# Patient Record
Sex: Male | Born: 1955 | Race: Black or African American | Marital: Single | Smoking: Never smoker
Health system: Southern US, Community
[De-identification: ages and names within clinical notes are randomized; demographics above are authoritative.]

## PROBLEM LIST (undated history)

## (undated) DIAGNOSIS — B019 Varicella without complication: Secondary | ICD-10-CM

## (undated) DIAGNOSIS — G43909 Migraine, unspecified, not intractable, without status migrainosus: Secondary | ICD-10-CM

## (undated) DIAGNOSIS — J45909 Unspecified asthma, uncomplicated: Secondary | ICD-10-CM

## (undated) DIAGNOSIS — K429 Umbilical hernia without obstruction or gangrene: Secondary | ICD-10-CM

## (undated) DIAGNOSIS — J309 Allergic rhinitis, unspecified: Secondary | ICD-10-CM

## (undated) HISTORY — DX: Unspecified asthma, uncomplicated: J45.909

## (undated) HISTORY — DX: Allergic rhinitis, unspecified: J30.9

## (undated) HISTORY — DX: Migraine, unspecified, not intractable, without status migrainosus: G43.909

## (undated) HISTORY — DX: Varicella without complication: B01.9

---

## 1989-03-18 HISTORY — PX: ARTHROSCOPIC REPAIR ACL: SUR80

## 1997-03-18 HISTORY — PX: NASAL SINUS SURGERY: SHX719

## 1997-06-22 ENCOUNTER — Ambulatory Visit (HOSPITAL_COMMUNITY): Admission: RE | Admit: 1997-06-22 | Discharge: 1997-06-22 | Payer: Self-pay | Admitting: *Deleted

## 2006-05-05 ENCOUNTER — Ambulatory Visit: Payer: Self-pay | Admitting: Internal Medicine

## 2006-06-09 ENCOUNTER — Ambulatory Visit: Payer: Self-pay | Admitting: Internal Medicine

## 2006-06-09 LAB — CONVERTED CEMR LAB
ALT: 59 units/L — ABNORMAL HIGH (ref 0–40)
Alkaline Phosphatase: 73 units/L (ref 39–117)
BUN: 10 mg/dL (ref 6–23)
CO2: 35 meq/L — ABNORMAL HIGH (ref 19–32)
Calcium: 9.2 mg/dL (ref 8.4–10.5)
Creatinine, Ser: 1.2 mg/dL (ref 0.4–1.5)
HCT: 42.9 % (ref 39.0–52.0)
MCHC: 33.7 g/dL (ref 30.0–36.0)
Potassium: 4.9 meq/L (ref 3.5–5.1)
Total Bilirubin: 0.7 mg/dL (ref 0.3–1.2)
Total CHOL/HDL Ratio: 2.2
Total Protein: 6.2 g/dL (ref 6.0–8.3)
WBC: 3.7 10*3/uL — ABNORMAL LOW (ref 4.5–10.5)

## 2006-07-04 ENCOUNTER — Ambulatory Visit: Payer: Self-pay | Admitting: Internal Medicine

## 2006-08-28 ENCOUNTER — Ambulatory Visit: Payer: Self-pay | Admitting: Internal Medicine

## 2006-08-28 LAB — CONVERTED CEMR LAB
ALT: 37 units/L (ref 0–40)
Alkaline Phosphatase: 59 units/L (ref 39–117)
Basophils Absolute: 0 10*3/uL (ref 0.0–0.1)
Eosinophils Absolute: 0.1 10*3/uL (ref 0.0–0.6)
Hep B S Ab: POSITIVE — AB
Hepatitis B Surface Ag: NEGATIVE
MCHC: 34.1 g/dL (ref 30.0–36.0)
MCV: 95 fL (ref 78.0–100.0)
Platelets: 213 10*3/uL (ref 150–400)
RBC: 4.53 M/uL (ref 4.22–5.81)

## 2006-09-10 ENCOUNTER — Ambulatory Visit: Payer: Self-pay | Admitting: Internal Medicine

## 2006-10-28 DIAGNOSIS — J309 Allergic rhinitis, unspecified: Secondary | ICD-10-CM | POA: Insufficient documentation

## 2007-03-09 ENCOUNTER — Ambulatory Visit: Payer: Self-pay | Admitting: Internal Medicine

## 2007-03-15 LAB — CONVERTED CEMR LAB
Basophils Absolute: 0 10*3/uL (ref 0.0–0.1)
Eosinophils Relative: 1.9 % (ref 0.0–5.0)
Glucose, Bld: 92 mg/dL (ref 70–99)
HCT: 47.4 % (ref 39.0–52.0)
MCHC: 34.1 g/dL (ref 30.0–36.0)
Neutrophils Relative %: 38.5 % — ABNORMAL LOW (ref 43.0–77.0)
RBC: 4.98 M/uL (ref 4.22–5.81)
RDW: 12.4 % (ref 11.5–14.6)
WBC: 4 10*3/uL — ABNORMAL LOW (ref 4.5–10.5)

## 2007-03-17 ENCOUNTER — Ambulatory Visit: Payer: Self-pay | Admitting: Internal Medicine

## 2007-03-17 DIAGNOSIS — D709 Neutropenia, unspecified: Secondary | ICD-10-CM | POA: Insufficient documentation

## 2007-03-17 DIAGNOSIS — R7309 Other abnormal glucose: Secondary | ICD-10-CM | POA: Insufficient documentation

## 2007-03-17 DIAGNOSIS — J45909 Unspecified asthma, uncomplicated: Secondary | ICD-10-CM | POA: Insufficient documentation

## 2007-08-31 ENCOUNTER — Ambulatory Visit: Payer: Self-pay | Admitting: Internal Medicine

## 2007-08-31 LAB — CONVERTED CEMR LAB
ALT: 46 units/L (ref 0–53)
AST: 37 units/L (ref 0–37)
Albumin: 3.9 g/dL (ref 3.5–5.2)
BUN: 13 mg/dL (ref 6–23)
CO2: 32 meq/L (ref 19–32)
Chloride: 104 meq/L (ref 96–112)
Cholesterol: 158 mg/dL (ref 0–200)
Creatinine, Ser: 1.1 mg/dL (ref 0.4–1.5)
GFR calc non Af Amer: 75 mL/min
Glucose, Bld: 93 mg/dL (ref 70–99)
Hgb A1c MFr Bld: 5.8 % (ref 4.6–6.0)
LDL Cholesterol: 90 mg/dL (ref 0–99)
Total Protein: 6.5 g/dL (ref 6.0–8.3)
Triglycerides: 33 mg/dL (ref 0–149)

## 2007-09-07 ENCOUNTER — Ambulatory Visit: Payer: Self-pay | Admitting: Internal Medicine

## 2009-07-14 ENCOUNTER — Telehealth: Payer: Self-pay | Admitting: *Deleted

## 2009-07-24 ENCOUNTER — Encounter: Payer: Self-pay | Admitting: Internal Medicine

## 2009-07-25 ENCOUNTER — Telehealth: Payer: Self-pay | Admitting: *Deleted

## 2009-07-27 ENCOUNTER — Telehealth: Payer: Self-pay | Admitting: Internal Medicine

## 2009-08-06 ENCOUNTER — Encounter: Payer: Self-pay | Admitting: Internal Medicine

## 2009-08-30 ENCOUNTER — Ambulatory Visit: Payer: Self-pay | Admitting: Internal Medicine

## 2009-08-30 LAB — CONVERTED CEMR LAB
ALT: 55 units/L — ABNORMAL HIGH (ref 0–53)
Albumin: 4.6 g/dL (ref 3.5–5.2)
Alkaline Phosphatase: 82 units/L (ref 39–117)
Basophils Relative: 0.4 % (ref 0.0–3.0)
Bilirubin, Direct: 0.2 mg/dL (ref 0.0–0.3)
CO2: 34 meq/L — ABNORMAL HIGH (ref 19–32)
Chloride: 104 meq/L (ref 96–112)
Eosinophils Absolute: 0.1 10*3/uL (ref 0.0–0.7)
Eosinophils Relative: 2.3 % (ref 0.0–5.0)
Hemoglobin: 15.7 g/dL (ref 13.0–17.0)
LDL Cholesterol: 94 mg/dL (ref 0–99)
Lymphocytes Relative: 32.2 % (ref 12.0–46.0)
MCHC: 34 g/dL (ref 30.0–36.0)
MCV: 98.9 fL (ref 78.0–100.0)
Monocytes Absolute: 0.5 10*3/uL (ref 0.1–1.0)
Neutro Abs: 3.3 10*3/uL (ref 1.4–7.7)
Neutrophils Relative %: 56.3 % (ref 43.0–77.0)
Nitrite: NEGATIVE
RBC: 4.66 M/uL (ref 4.22–5.81)
Sodium: 145 meq/L (ref 135–145)
Specific Gravity, Urine: 1.015
Total CHOL/HDL Ratio: 2
Total Protein: 6.8 g/dL (ref 6.0–8.3)
Urobilinogen, UA: 0.2
WBC Urine, dipstick: NEGATIVE
WBC: 5.9 10*3/uL (ref 4.5–10.5)

## 2009-09-06 ENCOUNTER — Ambulatory Visit: Payer: Self-pay | Admitting: Internal Medicine

## 2009-09-06 DIAGNOSIS — R635 Abnormal weight gain: Secondary | ICD-10-CM | POA: Insufficient documentation

## 2009-09-06 DIAGNOSIS — E875 Hyperkalemia: Secondary | ICD-10-CM

## 2009-09-06 DIAGNOSIS — R945 Abnormal results of liver function studies: Secondary | ICD-10-CM | POA: Insufficient documentation

## 2009-09-06 DIAGNOSIS — G43909 Migraine, unspecified, not intractable, without status migrainosus: Secondary | ICD-10-CM | POA: Insufficient documentation

## 2009-09-08 ENCOUNTER — Encounter (INDEPENDENT_AMBULATORY_CARE_PROVIDER_SITE_OTHER): Payer: Self-pay | Admitting: *Deleted

## 2009-09-25 ENCOUNTER — Encounter (INDEPENDENT_AMBULATORY_CARE_PROVIDER_SITE_OTHER): Payer: Self-pay | Admitting: *Deleted

## 2009-09-27 ENCOUNTER — Ambulatory Visit: Payer: Self-pay | Admitting: Internal Medicine

## 2009-10-04 ENCOUNTER — Ambulatory Visit: Payer: Self-pay | Admitting: Internal Medicine

## 2009-10-04 LAB — CONVERTED CEMR LAB
Albumin: 4.1 g/dL (ref 3.5–5.2)
BUN: 17 mg/dL (ref 6–23)
CO2: 31 meq/L (ref 19–32)
Calcium: 9.3 mg/dL (ref 8.4–10.5)
Creatinine, Ser: 1 mg/dL (ref 0.4–1.5)
GFR calc non Af Amer: 103.16 mL/min (ref >60)
Glucose, Bld: 93 mg/dL (ref 70–99)
Iron: 128 ug/dL (ref 42–165)
Saturation Ratios: 40.1 % (ref 20.0–50.0)
Sodium: 139 meq/L (ref 135–145)
Total Protein: 6.4 g/dL (ref 6.0–8.3)
Transferrin: 228 mg/dL (ref 212.0–360.0)

## 2009-10-05 ENCOUNTER — Encounter: Payer: Self-pay | Admitting: Internal Medicine

## 2009-10-05 LAB — CONVERTED CEMR LAB
HCV Ab: NEGATIVE
Hepatitis B Surface Ag: NEGATIVE

## 2009-10-09 ENCOUNTER — Ambulatory Visit: Payer: Self-pay | Admitting: Internal Medicine

## 2009-10-09 DIAGNOSIS — N529 Male erectile dysfunction, unspecified: Secondary | ICD-10-CM

## 2009-10-16 ENCOUNTER — Telehealth: Payer: Self-pay | Admitting: *Deleted

## 2010-03-06 ENCOUNTER — Telehealth: Payer: Self-pay | Admitting: *Deleted

## 2010-03-22 ENCOUNTER — Encounter: Payer: Self-pay | Admitting: Internal Medicine

## 2010-04-17 NOTE — Medication Information (Signed)
Summary: Denial of Coverage for Rhinocort  Denial of Coverage for Rhinocort   Imported By: Maryln Gottron 07/31/2009 15:11:58  _____________________________________________________________________  External Attachment:    Type:   Image     Comment:   External Document

## 2010-04-17 NOTE — Progress Notes (Signed)
Summary: Pt req script for Cialis   to [facility]   Phone Note Call from Patient Call back at Home Phone [phone]   Caller: Patient Summary of Call: Pt req to change from Viagra to Cialis everyday.    Please call in script to [facility]  Initial call taken by: Lucy Antigua,  October 16, 2009 11:54 AM  Follow-up for Phone Call        can do this  cilalis 2.5 once daily disp 30  but document reason for change .    Follow-up by: Madelin Headings MD,  October 16, 2009 1:20 PM  Additional Follow-up for Phone Call Additional follow up Details #1::        Pt tried the Viagra and said that it was ok, but pt would like to be able to take a med everyday, that is why he would like to change to Cialis.   Additional Follow-up by: Lucy Antigua,  October 16, 2009 2:42 PM    Additional Follow-up for Phone Call Additional follow up Details #2::    Rx sent to pharmacy. Follow-up by: Romualdo Bolk, CMA (AAMA),  October 16, 2009 3:36 PM  New/Updated Medications: CIALIS 2.5 MG TABS (TADALAFIL) 1 by mouth once daily Prescriptions: CIALIS 2.5 MG TABS (TADALAFIL) 1 by mouth once daily  #30 x 0   Entered by:   Romualdo Bolk, CMA (AAMA)   Authorized by:   Madelin Headings MD   Signed by:   Romualdo Bolk, CMA (AAMA) on 10/16/2009   Method used:   Electronically to        [facility] [address] [phone]* (retail)       [address]       [address]       [place], [place]  [address]       Ph: [phone]       Fax: [phone]   RxID:   [id number]

## 2010-04-17 NOTE — Letter (Signed)
Summary: Previsit letter  [facility]  [address] [place], [place] [address]   Phone: [phone]  Fax: [phone]       09/08/2009 MRN: [id number]  [facility] [address] [place], [place]  [address]  Dear [name],  Welcome to the Gastroenterology Division at [facility].    You are scheduled to see a nurse for your pre-procedure visit on 09/27/2009 at 8:00AM on the 3rd floor at [facility], [address]  We ask that you try to arrive at our office 15 minutes prior to your appointment time to allow for check-in.  Your nurse visit will consist of discussing your medical and surgical history, your immediate family medical history, and your medications.    Please bring a complete list of all your medications or, if you prefer, bring the medication bottles and we will list them.  We will need to be aware of both prescribed and over the counter drugs.  We will need to know exact dosage information as well.  If you are on blood thinners (Coumadin, Plavix, Aggrenox, Ticlid, etc.) please call our office today/prior to your appointment, as we need to consult with your physician about holding your medication.   Please be prepared to read and sign documents such as consent forms, a financial agreement, and acknowledgement forms.  If necessary, and with your consent, a friend or relative is welcome to sit-in on the nurse visit with you.  Please bring your insurance card so that we may make a copy of it.  If your insurance requires a referral to see a specialist, please bring your referral form from your primary care physician.  No co-pay is required for this nurse visit.     If you cannot keep your appointment, please call [phone] to cancel or reschedule prior to your appointment date.  This allows [place] the opportunity to schedule an appointment for another patient in need of care.    Thank you for choosing [facility] for your medical  needs.  We appreciate the opportunity to care for you.  Please visit [place] at our website  to learn more about our practice.                     Sincerely.                                                                                                                   The Gastroenterology Division

## 2010-04-17 NOTE — Medication Information (Signed)
Summary: Prior Authorization Request for Rhinocort AQ  Prior Authorization Request for Rhinocort AQ   Imported By: Maryln Gottron 08/09/2009 14:00:47  _____________________________________________________________________  External Attachment:    Type:   Image     Comment:   External Document

## 2010-04-17 NOTE — Miscellaneous (Signed)
Summary: [facility]  Clinical Lists Changes  Medications: Added new medication of MOVIPREP 100 GM  SOLR (PEG-KCL-NACL-NASULF-NA ASC-C) As per prep instructions. - Signed Rx of MOVIPREP 100 GM  SOLR (PEG-KCL-NACL-NASULF-NA ASC-C) As per prep instructions.;  #1 x 0;  Signed;  Entered by: Ezra Sites RN;  Authorized by: Iva Boop MD, [facility];  Method used: Electronically to [facility] [phone]*, [address], [address], [place], [place]  [address], Ph: [phone], Fax: [phone] Observations: Added new observation of NKA: T (09/27/2009 7:49)    Prescriptions: MOVIPREP 100 GM  SOLR (PEG-KCL-NACL-NASULF-NA ASC-C) As per prep instructions.  #1 x 0   Entered by:   Ezra Sites RN   Authorized by:   Iva Boop MD, [facility]   Signed by:   Ezra Sites RN on 09/27/2009   Method used:   Electronically to        [facility] [address] [phone]* (retail)       [address]       [address]       [place], [place]  [address]       Ph: [phone]       Fax: [phone]   RxID:   [id number]   Appended Document: [facility] PV pt is c/o occasional dysphagia.  Did not want to reschedule colonoscopy and make OV at this time.  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  He will call office and schedule OV when he knows what his work schedule will be.  Appended Document: [facility] PV call patient re: dysphagia problems, please get a hx and we may be able to schedule for EGD also (without OV)  Appended Document: [facility] I spoke with the patient he has solid food dysphagia and he has had to induce vomiting.  He feels the food just stops midway in his chest. This happens about 1 time a week for the last 3-4 months.  He has asked me to cancel his scheduled colon.  He would like Dr Leone Payor to review and we can set up both procedures for Sept.     Appended Document: [facility] PV chart reviewed he can be set up for EGD/dili and colonoscopy direct in [facility]  Appended Document: [facility] PV Patient aware, he wants to  call back to schedule in [facility] Sept or October

## 2010-04-17 NOTE — Progress Notes (Signed)
Summary: Change from nasonex to flonase  Phone Note From Pharmacy   Caller: [facility] Reason for Call: Patient requests substitution Summary of Call: Pt wants rx changed from Nasonex to flonase. Can we do this? Initial call taken by: Romualdo Bolk, CMA Duncan Dull),  Jul 27, 2009 10:41 AM  Follow-up for Phone Call        Per Dr. Fabian Sharp- okay to do. Rx sent to pharmacy. Follow-up by: Romualdo Bolk, CMA (AAMA),  Jul 27, 2009 11:32 AM    New/Updated Medications: FLUTICASONE PROPIONATE 50 MCG/ACT SUSP (FLUTICASONE PROPIONATE) 2 sprays in each nostril daily Prescriptions: FLUTICASONE PROPIONATE 50 MCG/ACT SUSP (FLUTICASONE PROPIONATE) 2 sprays in each nostril daily  #1 x 11   Entered by:   Romualdo Bolk, CMA (AAMA)   Authorized by:   Madelin Headings MD   Signed by:   Romualdo Bolk, CMA (AAMA) on 07/27/2009   Method used:   Electronically to        [facility] [address] [phone]* (retail)       [address]       [address]       [place], [place]  [address]       Ph: [phone]       Fax: [phone]   RxID:   [id number]

## 2010-04-17 NOTE — Progress Notes (Signed)
Summary: Ins denied the rhinocort will cover flonase gen or nasonex  Phone Note Call from Patient   Caller: Patient   [phone] Reason for Call: Talk to Nurse, Talk to Doctor Summary of Call: Pt called to adv that [facility] that they have not received any info concerning a Rx for pt .Marland Kitchen... Pt [facility] [address] [facility] medication:  RHINOCORT AQUA 32 MCG/ACT SUSP.  Pt can be reached at [phone] with any questions or concerns.  Initial call taken by: Debbra Riding,  Jul 25, 2009 2:09 PM  Follow-up for Phone Call        Rx was denied by the pharmacy. Pt's ins will cover generic flonase or Nasonex. Pt is aware of this and would like to try one of these rx's. Can we call in this into his pharmacy? Follow-up by: Romualdo Bolk, CMA Duncan Dull),  Jul 25, 2009 4:13 PM  Additional Follow-up for Phone Call Additional follow up Details #1::        nasonex; 2 sprays each nares q d  disp 1 refill x 12  Additional Follow-up by: Madelin Headings MD,  Jul 26, 2009 8:25 AM    Additional Follow-up for Phone Call Additional follow up Details #2::    Rx sent electronically and pt aware of the change. Follow-up by: Romualdo Bolk, CMA (AAMA),  Jul 26, 2009 8:37 AM  New/Updated Medications: NASONEX 50 MCG/ACT SUSP (MOMETASONE FUROATE) 2 sprays in each nostrill daily Prescriptions: NASONEX 50 MCG/ACT SUSP (MOMETASONE FUROATE) 2 sprays in each nostrill daily  #1 x 12   Entered by:   Romualdo Bolk, CMA (AAMA)   Authorized by:   Madelin Headings MD   Signed by:   Romualdo Bolk, CMA (AAMA) on 07/26/2009   Method used:   [facility]  [address] [phone]* (retail)       [address]       [place], [place]  [id number]       Ph: [phone] or [phone]       Fax: [phone]   RxID:   [id number]

## 2010-04-17 NOTE — Progress Notes (Signed)
Summary: Rx Refill  Phone Note Refill Request Call back at Home Phone [phone] Message from:  Patient on July 14, 2009 3:47 PM  Refills Requested: Medication #1:  RHINOCORT AQUA 32 MCG/ACT SUSP Spray 2 spray into both nostrils once a day   Notes: Generic Pt would like to have generic sent in.   Method Requested: Electronic Initial call taken by: Trixie Dredge,  July 14, 2009 3:48 PM  Follow-up for Phone Call        Rx sent to the pharmacy. Follow-up by: Romualdo Bolk, CMA Duncan Dull),  July 14, 2009 3:51 PM    Prescriptions: RHINOCORT AQUA 32 MCG/ACT SUSP (BUDESONIDE (NASAL)) Spray 2 spray into both nostrils once a day  #1 x 1   Entered by:   Romualdo Bolk, CMA (AAMA)   Authorized by:   Madelin Headings MD   Signed by:   Romualdo Bolk, CMA (AAMA) on 07/14/2009   Method used:   [facility]  [address] [phone]* (retail)       [address]       [place], [place]  [id number]       Ph: [phone] or [phone]       Fax: [phone]   RxID:   [id number]

## 2010-04-17 NOTE — Letter (Signed)
Summary: [facility] Instructions  [facility]  [address] [place], [place] [address]   Phone: [phone]  Fax: [phone]       HILMER ALIBERTI    04-14-1955    MRN: [id number]        Procedure Day Dorna Bloom:  tuesday 10/10/2009     Arrival Time: 12:30 pm      Procedure Time: 1:30 pm     Location of Procedure:                    _x _  [facility] (4th Floor)                        PREPARATION FOR COLONOSCOPY WITH MOVIPREP   Starting 5 days prior to your procedure Thursday 7/21 do not eat nuts, seeds, popcorn, corn, beans, peas,  salads, or any raw vegetables.  Do not take any fiber supplements (e.g. Metamucil, Citrucel, and Benefiber).  THE DAY BEFORE YOUR PROCEDURE         DATE: Monday 7/25 1.  Drink clear liquids the entire day-NO SOLID FOOD  2.  Do not drink anything colored red or purple.  Avoid juices with pulp.  No orange juice.  3.  Drink at least 64 oz. (8 glasses) of fluid/clear liquids during the day to prevent dehydration and help the prep work efficiently.  CLEAR LIQUIDS INCLUDE: Water Jello Ice Popsicles Tea (sugar ok, no milk/cream) Powdered fruit flavored drinks Coffee (sugar ok, no milk/cream) Gatorade Juice: apple, white grape, white cranberry  Lemonade Clear bullion, consomm, broth Carbonated beverages (any kind) Strained chicken noodle soup Hard Candy                             4.  In the morning, mix first dose of MoviPrep solution:    Empty 1 Pouch A and 1 Pouch B into the disposable container    Add lukewarm drinking water to the top line of the container. Mix to dissolve    Refrigerate (mixed solution should be used within 24 hrs)  5.  Begin drinking the prep at 5:00 p.m. The MoviPrep container is divided by 4 marks.   Every 15 minutes drink the solution down to the next mark (approximately 8 oz) until the full liter is complete.   6.  Follow completed prep with 16 oz of clear liquid of your choice (Nothing  red or purple).  Continue to drink clear liquids until bedtime.  7.  Before going to bed, mix second dose of MoviPrep solution:    Empty 1 Pouch A and 1 Pouch B into the disposable container    Add lukewarm drinking water to the top line of the container. Mix to dissolve    Refrigerate  THE DAY OF YOUR PROCEDURE      DATE: Tuesday 7/26  Beginning at 8:30 a.m. (5 hours before procedure):         1. Every 15 minutes, drink the solution down to the next mark (approx 8 oz) until the full liter is complete.  2. Follow completed prep with 16 oz. of clear liquid of your choice.    3. You may drink clear liquids until 11:30 am (2 HOURS BEFORE PROCEDURE).   MEDICATION INSTRUCTIONS  Unless otherwise instructed, you should take regular prescription medications with a small sip of water   as early as possible the morning of your  procedure.          OTHER INSTRUCTIONS  You will need a responsible adult at least 53 years of age to accompany you and drive you home.   This person must remain in the waiting room during your procedure.  Wear loose fitting clothing that is easily removed.  Leave jewelry and other valuables at home.  However, you may wish to bring a book to read or  an iPod/MP3 player to listen to music as you wait for your procedure to start.  Remove all body piercing jewelry and leave at home.  Total time from sign-in until discharge is approximately 2-3 hours.  You should go home directly after your procedure and rest.  You can resume normal activities the  day after your procedure.  The day of your procedure you should not:   Drive   Make legal decisions   Operate machinery   Drink alcohol   Return to work  You will receive specific instructions about eating, activities and medications before you leave.    The above instructions have been reviewed and explained to me by   Ezra Sites RN  September 27, 2009 8:20 AM     I fully understand and can  verbalize these instructions _____________________________ Date _________

## 2010-04-17 NOTE — Assessment & Plan Note (Signed)
Summary: ONE MTH ROV // RS   Vital Signs:  Patient profile:   55 year old male Weight:      178 pounds Pulse rate:   72 / minute BP sitting:   120 / 80  (left arm) Cuff size:   regular  Vitals Entered By: Romualdo Bolk, CMA (AAMA) (October 09, 2009 8:25 AM) CC: Follow-up visit on labs   History of Present Illness: [name] comes in today  for follow up of a number of probelms.  HA   calendaring  . but forgot to bring   it to visit . Since last visit Only 2 days of sig HAs; other wise miild  or not present.      Would try no to take med s and just  rested with help.    Trying  to decrease   and avoiding meds.  took  med 10- 15 doses    excedrin  migraine.    Caffiene   slowed down.   Pulmonary:     Reschedule lung functions.     delaying for  financial reasons   but breathing is ok now   sometimes heat and air  is an issue.  LFTS: potassium  may have had a supplement that could have done this.    with potassium  Gi   to get egd also  because of signs /.  Co some problems iwth erectile dysfunction and usure if psychologic vs phsiologic.   Preventive Screening-Counseling & Management  Alcohol-Tobacco     Alcohol drinks/day: 0     Smoking Status: never  Caffeine-Diet-Exercise     Caffeine use/day: less than 1 a day     Does Patient Exercise: yes  Current Medications (verified): 1)  Fluticasone Propionate 50 Mcg/act Susp (Fluticasone Propionate) .... 2 Sprays in Each Nostril Daily 2)  Qvar 40 Mcg/act  Aers (Beclomethasone Dipropionate) .Marland Kitchen.. 1-2 Puffs Two Times A Day As Directed 3)  Proair Hfa 108 (90 Base) Mcg/act Aers (Albuterol Sulfate) .... 2 Puffs Qid As Needed Wheezing  .  Allergies (verified): No Known Drug Allergies  Past History:  Past medical, surgical, family and social histories (including risk factors) reviewed, and no changes noted (except as noted below).  Past Medical History: Reviewed history from 09/06/2009 and no changes required. Allergic  rhinitis Asthma Chicken pox as a child   Migraines Hx of elevated BP readings ? med in past.  Past Surgical History: Reviewed history from 09/06/2009 and no changes required. Sinus surgery 1999 R acl repair 1991     Past History:  Care Management: [facility]- 06/16/09 and 08/20/09 Gastroenterology: Leone Payor  Family History: Reviewed history from 03/17/2007 and no changes required. Family History of Arthritis Family History Diabetes 1st degree relative MOM Family History Hypertension Family History of Cardiovascular disorder Family History Thyroid disease  Social History: Reviewed history from 09/06/2009 and no changes required. 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    Physical Exam  General:  Well-developed,well-nourished,in no acute distress; alert,appropriate and cooperative throughout examination Head:  normocephalic, atraumatic, and no abnormalities observed.   Eyes:  vision grossly intact, pupils equal, and pupils  round.   Neck:  No deformities, masses, or tenderness noted.no thyromegaly.   Lungs:  Normal respiratory effort, chest expands symmetrically. Lungs are clear to auscultation, no crackles or wheezes.no dullness.   Heart:  Normal rate and regular rhythm. S1 and S2 normal without gallop, murmur, click, rub or other extra sounds. Abdomen:  Bowel sounds positive,abdomen soft and non-tender without masses, organomegaly or   noted. umbinsite good  Msk:  no joint swelling and no joint warmth.   Pulses:  pulses intact without delay   Neurologic:  alert & oriented X3, gait normal, and DTRs symmetrical and normal.   Skin:   turgor normal, color normal, no ecchymoses, and no petechiae.   Cervical Nodes:  No lymphadenopathy noted Psych:  Oriented X3, normally interactive, good eye contact, and not anxious appearing.   reviewed  labs  .  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 1/2 to 1 as needed  Problem # 4:  ASTHMA (ICD-493.90) Assessment: Improved  The following medications were removed from the medication list:    Prednisone 20 Mg Tabs (Prednisone) .Marland Kitchen... Take 3 by mouth once daily for 2 days then 2 by mouth once daily for  3 days or as directed His updated medication list for this problem includes:    Qvar 40 Mcg/act Aers (Beclomethasone dipropionate) .Marland Kitchen... 1-2 puffs two times a day as directed    Proair Hfa 108 (90 Base) Mcg/act Aers (Albuterol sulfate) .Marland Kitchen... 2 puffs qid as needed wheezing  .  Complete Medication List: 1)  Fluticasone Propionate 50 Mcg/act Susp (Fluticasone propionate) .... 2 sprays in each nostril daily 2)  Qvar 40 Mcg/act Aers (Beclomethasone dipropionate) .Marland Kitchen.. 1-2 puffs two times a day as directed 3)  Proair Hfa 108 (90 Base) Mcg/act Aers (Albuterol sulfate) .... 2 puffs qid as needed wheezing  . 4)  Viagra 100 Mg Tabs (Sildenafil citrate) .... 1/2 to 1 as needed  Patient Instructions: 1)  continue weaning the caffiene and excedrin migraine as much as possible. 2)  Your lab test are better  3)  Continue q var    every day   .   can use proair as needed.    4)  Can try viagra   avoid  hormone supplements that could possibly cause a problem.   call for  rx if wish  5)  Get the lung function tests when able.  6)  Continue monitoring your headaches . 7)  return office visit in 3 months    8)  am testosterone level  and LH   before visit .

## 2010-04-17 NOTE — Assessment & Plan Note (Signed)
Summary: cpx/njr   Vital Signs:  Patient profile:   55 year old male Height:      61.75 inches Weight:      178 pounds BMI:     32.94 Pulse rate:   78 / minute BP sitting:   128 / 80  (left arm) Cuff size:   regular  Vitals Entered By: Romualdo Bolk, CMA (AAMA) (September 06, 2009 10:42 AM)  Nutrition Counseling: Patient's BMI is greater than 25 and therefore counseled on weight management options. CC: CPX-Pt was seen at [facility] on 06/16/09 and 08/20/09 for cold symptoms. Coughing, congestion and Asthma has increased. Pt was given augemtin in April and a Z-pac and pred in 08/20/09. Pt is also having miagraines. Pt was given a rx by his old md but does remember the name of it.   History of Present Illness: [name] comesin for preventive visit but has multiple other interim issues going on.  Since last visit with [place]  he has had a resurgence of migraine he had in the past  ? triggered with his asthma allergies.  Often right sided dull to sharp . ocass scotoma/  no other neuro   .sx with this. Apparently on suppressive therapy in the remote past. Asthma / Sinusitis ?  Has been seen urgent care( see above)    had been on q var 1 month but using this prn  and on       no rescue  inhaler.  Had neb at [facility] and this helped .  No record available  for this. but did hanve antibioitc andzpack and then augmentin which helped .  Did have some pred at one time ? effect on abov.   No alcohol  or sig tylenol but using migrain med otc such as advil aleve. No chang ein diet or sleep and no OSA by report.   using protein supplements         Also concern about weith gain ober the years   used to weight 156.   Preventive Care Screening  Prior Values:    PSA:  0.60 (08/30/2009)   Preventive Screening-Counseling & Management  Alcohol-Tobacco     Alcohol drinks/day: 0     Smoking Status: never  Caffeine-Diet-Exercise     Caffeine use/day: less than 1 a day     Does Patient  Exercise: yes  Hep-HIV-STD-Contraception     Dental Visit-last 6 months no  Safety-Violence-Falls     Seat Belt Use: yes     Firearms in the Home: no firearms in the home     Smoke Detectors: yes      Blood Transfusions:  no.    Current Medications (verified): 1)  Fluticasone Propionate 50 Mcg/act Susp (Fluticasone Propionate) .... 2 Sprays in Each Nostril Daily 2)  Qvar 40 Mcg/act  Aers (Beclomethasone Dipropionate) .Marland Kitchen.. 1-2 Puffs Two Times A Day As Directed  Allergies (verified): No Known Drug Allergies  Past History:  Past Medical History: Allergic rhinitis Asthma Chicken pox as a child   Migraines Hx of elevated BP readings ? med in past.  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         weight gain  Physical Exam General Appearance: well developed, well nourished, no acute distress mild congestion Eyes: conjunctiva and lids normal, PERRLA, EOMI, WNL glasses  Ears, Nose, Mouth, Throat: TM clear, nares clear, oral exam WNL Neck: supple, no lymphadenopathy, no thyromegaly, no JVD Respiratory: clear to auscultation and  percussion, respiratory effort normal Cardiovascular: regular rate and rhythm, S1-S2, no murmur, rub or gallop, no bruits, peripheral pulses normal and symmetric, no cyanosis, clubbing, edema or varicosities   decrease hair mid leg down   bilaterally  flat feet  Chest: no scars, masses, tenderness; no asymmetry, skin changes, nipple discharge, no gynecomastia   Gastrointestinal: soft, non-tender; no hepatosplenomegaly, masses; active bowel sounds all quadrants, ; no masses, tenderness, hemorrhoids  Genitourinary: no hernia, testicular mass, penile discharge, priapism or 1+  no nodules  prostate enlargement Lymphatic: no cervical, axillary or inguinal adenopathy Musculoskeletal: gait normal, muscle tone and strength WNL, no joint swelling, effusions, discoloration, crepitus  Skin: clear, good turgor, color WNL, no rashes, lesions, or ulcerations Neurologic: normal mental status, normal reflexes, normal strength, sensation, and motion Psychiatric: alert; oriented to person, place and time Other Exam:  see labs  elevated potass and lfts    rest good  EKG nsr  rate 79     Impression & Recommendations:  Problem # 1:  PREVENTIVE HEALTH CARE (ICD-V70.0)  Discussed nutrition,exercise,diet,healthy weight, vitamin D and calcium.      Orders: EKG w/ Interpretation (93000)  Reviewed preventive care protocols, scheduled due services, and updated immunizations.  Problem # 2:  ASTHMA (ICD-493.90) Assessment: Deteriorated  after rx for sinusitis  elsewhere  .  need more data    rec pfts and follow up  and ifneeded will get pulmonary to see His updated medication list for this problem includes:    Qvar 40 Mcg/act Aers (Beclomethasone dipropionate) .Marland Kitchen... 1-2 puffs two times a day as directed    Prednisone 20 Mg Tabs (Prednisone) .Marland Kitchen... Take 3 by mouth once daily for 2 days then 2 by mouth once daily for  3 days or as directed    Proair Hfa 108 (90 Base) Mcg/act Aers (Albuterol sulfate) .Marland Kitchen... 2 puffs qid  as needed wheezing  .  Orders: Pulmonary Referral (Pulmonary)  Problem # 3:  HYPERKALEMIA (ICD-276.7) Assessment: New  limit potassium supplements  . I think thisis  probably u a lab effect   and not true (  no other reason for this)    and seeing this a trend in the clinic.   also ekg not cw lab.   Orders: EKG w/ Interpretation (93000)  Problem # 4:  MIGRAINE HEADACHE (ICD-346.90) Assessment: Deteriorated hx of same and recent rx with  antibiotcs. used to be on prevention med years ago. recently almost daily and using otc migraine med few times a week    at risk for [facility]  .  ho given and counseled and calendar   Problem # 5:  LIVER FUNCTION TESTS, ABNORMAL (ICD-794.8) Assessment: New poss from  meds for has and illness  . Needs .fu   consider [place] if  needed    reviewed paper chart and had this once   3-4 years ago.  Problem # 6:  WEIGHT GAIN (ICD-783.1) seems stable in the past 3 years   weight was  185 in 2008  Complete Medication List: 1)  Fluticasone Propionate 50 Mcg/act Susp (Fluticasone propionate) .... 2 sprays in each nostril daily 2)  Qvar 40 Mcg/act Aers (Beclomethasone dipropionate) .Marland Kitchen.. 1-2 puffs two times a day as directed 3)  Prednisone 20 Mg Tabs (Prednisone) .... Take 3 by mouth once daily for 2 days then 2 by mouth once daily for  3 days or as directed 4)  Proair Hfa 108 (90 Base) Mcg/act Aers (Albuterol sulfate) .... 2 puffs qid as needed wheezing  .  Other Orders: Gastroenterology Referral (GI) Tdap => 8yrs IM ([address]) Admin 1st Vaccine (66440)  Patient Instructions: 1)  take the prednisone to  help the headache and  asthma signs . 2)  take q var 1-2 puffs two times a day every day. 3)  Can  use rescue inhaler  as needed.    in the meantime  4)  I rec  we get Pulmonary function tests to check out your asthma in the meantime . 5)  Avoid potassium supplements for now 6)  BMP,   and  lfts,  hep b aby, hep b ag ,   hep c  aby , ibc panel  .( dx abnormal lfts  and  elevated Potassium ) in 2-3 weeks  7)  ROV in 3-4 weeks  after  labs and   pfts  results are back.  8)  Calendar   your headache in the meantime. Prescriptions: PROAIR HFA 108 (90 BASE) MCG/ACT AERS (ALBUTEROL SULFATE) 2 puffs qid as needed wheezing  .  #1 x 0   Entered and Authorized by:   Madelin Headings MD   Signed by:   Madelin Headings MD on 09/06/2009   Method used:   Electronically to        [facility] [address] [phone]* (retail)       [address]       [address]       [place], [place]  [address]       Ph: [phone]       Fax: [phone]   RxID:   [id number] PREDNISONE 20 MG TABS (PREDNISONE) take 3 by mouth once daily for 2 days then 2 by mouth once daily for  3 days or as directed  #20 x 0   Entered and Authorized by:   Madelin Headings MD   Signed by:   Madelin Headings MD on 09/06/2009   Method used:   Electronically to        [facility] [address] [phone]* (retail)       [address]       [address]       [place], [place]  [address]       Ph: [phone]       Fax: [phone]   RxID:   [id number]    Immunizations Administered:  Tetanus Vaccine:    Vaccine Type: Tdap    Site: right deltoid    Mfr: GlaxoSmithKline    Dose: 0.5 ml    Route: IM    Given by: Romualdo Bolk, CMA (AAMA)    Exp. Date: 06/10/2011    Lot #: [id number]  prolonged visit in addition to preventive care counseling today

## 2010-04-19 NOTE — Progress Notes (Signed)
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  Medication #2:  PROAIR HFA 108 (90 BASE) MCG/ACT AERS 2 puffs qid as needed wheezing  .   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    Initial call taken by: Debbra Riding,  March 06, 2010 10:03 AM  Follow-up for Phone Call        ok x 2 on the proair and 6 on the q var  Follow-up by: Madelin Headings MD,  March 06, 2010 1:20 PM  Additional Follow-up for Phone Call Additional follow up Details #1::        rx sent to pharmacy Additional Follow-up by: Romualdo Bolk, CMA Duncan Dull),  March 06, 2010 3:17 PM    Prescriptions: PROAIR HFA 108 (90 BASE) MCG/ACT AERS (ALBUTEROL SULFATE) 2 puffs qid as needed wheezing  .  #1 x 1   Entered by:   Romualdo Bolk, CMA (AAMA)   Authorized by:   Madelin Headings MD   Signed by:   Romualdo Bolk, CMA (AAMA) on 03/06/2010   Method used:   Electronically to        [facility] [address] [phone]* (retail)       [address]       [address]       [place], [place]  [address]       Ph: [phone]       Fax: [phone]   RxID:   [id number] QVAR 40 MCG/ACT  AERS (BECLOMETHASONE DIPROPIONATE) 1-2 puffs two times a day as directed  #1 x 5   Entered by:   Romualdo Bolk, CMA (AAMA)   Authorized by:   Madelin Headings MD   Signed by:   Romualdo Bolk, CMA (AAMA) on 03/06/2010   Method used:   Electronically to        [facility] [address] [phone]* (retail)       [address]       [address]       [place], [place]  [address]       Ph: [phone]       Fax: [phone]   RxID:   [id number]

## 2010-04-25 NOTE — Letter (Signed)
[facility]   Imported By: Maryln Gottron 04/16/2010 12:42:48  _____________________________________________________________________  External Attachment:    Type:   Image     Comment:   External Document

## 2010-08-03 NOTE — Assessment & Plan Note (Signed)
[facility]                            [facility] OFFICE NOTE   NAME:NETTLESLelon, [name]                     MRN:          [id number]  DATE:05/05/2006                            DOB:          Dec 15, 1955    CHIEF COMPLAINT:  New patient to establish.  Seen recently at [facility]  for pneumonia followup.   HISTORY OF PRESENT ILLNESS:  [name] is a 55 year old, nonsmoking,  single, African American male who comes in today for a first-time visit.  He is establishing with a primary care physician, as his previous one  was Dr. Mila Palmer.  He is no longer in practice.  In the meantime, he saw  [facility] on April 18, 2006 after having had a recurrent cough with  increasing and decreasing shortness of breath, and thought he had  bronchitis since October of 2007.  He had used over-the-counter  medicines without any help, and had been waxing and waning with  __________, however, where he had increased symptoms after [place], and  went to Urgent Care because he then had developed a fever.  He  apparently had a chest x-ray there, and was told that he had perhaps  early pneumonia, and was treated as such.  Since that time, he has  improved, but still has congestion and cough, but no associated fever.   PAST MEDICAL HISTORY:  See database.  1. Arthritis symptoms.  2. Childhood asthma.  3. No hospitalizations.  4. Chickenpox as a child.  5. Seasonal rhinitis.  6. Allergies worse as a child.  He was tested at that time.  7. History of hypertension, and apparently was put on medication at      some point, and had some side effects from the medication.  Was      recently 128/70 of blood pressure.  8. History of migraine headaches.   SURGERIES:  1. Sinusitis 1999.  2. __________ problems 1991.  3. ACL right knee.   FAMILY HISTORY:  Positive for heart disease, hypertension and type 2  diabetes in mother.  Thyroid disease in his sister.  Otherwise,  noncontributory.   SOCIAL HISTORY:  Has 3 years of college.  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  Negative TAD.  Some caffeine.  Does try to do regular exercise.  No pets.  Household of  2.  Five and a half hours of sleep at night.  He does not know when his  last tetanus shot was.   MEDICATIONS:  None.   DRUG ALLERGIES:  NONE.   OBJECTIVE:  Height was not done.  Weight 185.  Temperature 98.8.  Pulse  72 and regular.  Blood pressure 130/80.  A WDWN, healthy-appearing gentleman, in no acute distress, with some  minimal congestion.  HEENT:  Unremarkable except for some mild nasal congestion.  NECK:  Supple without masses, thyromegaly or bruit.  CHEST:  CTA.  BS equal.  CARDIAC:  S1 and S2 without gallops or murmurs.  Peripheral pulses  present without delay.  EXTREMITIES:  Negative for cyanosis, clubbing or edema.  He is not icteric.  NEUROLOGIC:  Grossly normal.   IMPRESSION:  1. Recent history of prolonged cough with recent diagnosis of      pneumonia.  I suspect some underlying reactive airway disease or      others.  2. History of allergic rhinitis and childhood asthma.  3. History of elevated blood pressure with sided effects of      medication.  4. History of sinus surgery.  5. History of anterior cruciate ligament repair.   With his family history, type 2 diabetes, hypertension, etc., he does  need followup for these as appropriate.  He will get the records, and  follow up as needed.     Neta Mends. Panosh, MD  Electronically Signed    WKP/MedQ  DD: 05/11/2006  DT: 05/11/2006  Job #: [id number]

## 2010-08-16 ENCOUNTER — Other Ambulatory Visit: Payer: Self-pay | Admitting: Internal Medicine

## 2010-08-17 NOTE — Telephone Encounter (Signed)
return office visit in 3 months  8) am testosterone level and LH before visit

## 2011-04-16 ENCOUNTER — Other Ambulatory Visit: Payer: Self-pay | Admitting: Internal Medicine

## 2011-04-16 NOTE — Telephone Encounter (Signed)
Call pt and see if he has change providers and sent request to wrong md.

## 2011-04-18 NOTE — Telephone Encounter (Signed)
Pt last ov was in 2011. Pt unable to make ov now due to work. Pt call be reached at [phone]

## 2011-04-19 NOTE — Telephone Encounter (Signed)
Needs OV . IN the mean time ask if he is having any symptoms .  If he  Is stable then can rx 1 inhaler each q var and  proair  Until her gets in for visit.

## 2012-03-23 ENCOUNTER — Encounter: Payer: Self-pay | Admitting: Internal Medicine

## 2012-03-23 ENCOUNTER — Ambulatory Visit (INDEPENDENT_AMBULATORY_CARE_PROVIDER_SITE_OTHER): Payer: BC Managed Care – PPO | Admitting: Internal Medicine

## 2012-03-23 VITALS — BP 124/84 | HR 75 | Temp 97.5°F | Ht 61.75 in | Wt 179.0 lb

## 2012-03-23 DIAGNOSIS — J309 Allergic rhinitis, unspecified: Secondary | ICD-10-CM

## 2012-03-23 DIAGNOSIS — Z0000 Encounter for general adult medical examination without abnormal findings: Secondary | ICD-10-CM

## 2012-03-23 DIAGNOSIS — R7309 Other abnormal glucose: Secondary | ICD-10-CM

## 2012-03-23 DIAGNOSIS — K429 Umbilical hernia without obstruction or gangrene: Secondary | ICD-10-CM

## 2012-03-23 DIAGNOSIS — G43909 Migraine, unspecified, not intractable, without status migrainosus: Secondary | ICD-10-CM

## 2012-03-23 DIAGNOSIS — J45909 Unspecified asthma, uncomplicated: Secondary | ICD-10-CM

## 2012-03-23 DIAGNOSIS — M25569 Pain in unspecified knee: Secondary | ICD-10-CM

## 2012-03-23 DIAGNOSIS — M25561 Pain in right knee: Secondary | ICD-10-CM

## 2012-03-23 LAB — CBC WITH DIFFERENTIAL/PLATELET
Eosinophils Relative: 1.2 % (ref 0.0–5.0)
HCT: 44.6 % (ref 39.0–52.0)
Hemoglobin: 14.9 g/dL (ref 13.0–17.0)
Lymphs Abs: 2.1 10*3/uL (ref 0.7–4.0)
MCV: 96.9 fl (ref 78.0–100.0)
Monocytes Absolute: 0.4 10*3/uL (ref 0.1–1.0)
Monocytes Relative: 7.2 % (ref 3.0–12.0)
Neutro Abs: 2.8 10*3/uL (ref 1.4–7.7)
Platelets: 194 10*3/uL (ref 150.0–400.0)
WBC: 5.3 10*3/uL (ref 4.5–10.5)

## 2012-03-23 LAB — BASIC METABOLIC PANEL
BUN: 11 mg/dL (ref 6–23)
Chloride: 102 mEq/L (ref 96–112)
Glucose, Bld: 87 mg/dL (ref 70–99)
Potassium: 4.1 mEq/L (ref 3.5–5.1)
Sodium: 140 mEq/L (ref 135–145)

## 2012-03-23 LAB — HEMOGLOBIN A1C: Hgb A1c MFr Bld: 5.9 % (ref 4.6–6.5)

## 2012-03-23 LAB — HEPATIC FUNCTION PANEL
ALT: 41 U/L (ref 0–53)
AST: 31 U/L (ref 0–37)
Albumin: 4 g/dL (ref 3.5–5.2)
Total Bilirubin: 1.1 mg/dL (ref 0.3–1.2)
Total Protein: 7.2 g/dL (ref 6.0–8.3)

## 2012-03-23 LAB — LIPID PANEL
Cholesterol: 169 mg/dL (ref 0–200)
LDL Cholesterol: 96 mg/dL (ref 0–99)
Triglycerides: 48 mg/dL (ref 0.0–149.0)

## 2012-03-23 NOTE — Progress Notes (Signed)
Chief Complaint  Patient presents with  . Annual Exam    Pt is fasting    HPI: Patient comes in today for Preventive Health Care visit  Last visit was 7 2011  Since that time no major changes but has a few issues . Mother got sicjk and had to cancel colonoscopy  .   Past elevated bp ok  asthma  Somewhat  Bothering him recently  Doesn't always take q var daily  Rescue medication works  Right knee:  Hard to run  Can walk   1991  Knee surgery  acl  Has small umbi hernia left over from childhood and recently get sore after certain exercises  HAs still gets migraines  But not seeing specialist now .  Off meds coping.  ROS:  GEN/ HEENT: No fever, significant weight changes sweats headaches vision problems hearing changes, CV/ PULM; No chest pain shortness of breath cough, syncope,edema  change in exercise tolerance. GI /GU: No adominal pain, vomiting, change in bowel habits. No blood in the stool. No significant GU symptoms. SKIN/HEME: ,no acute skin rashes suspicious lesions or bleeding. No lymphadenopathy, nodules, masses.  NEURO/ PSYCH:  No neurologic signs such as weakness numbness. No depression anxiety. IMM/ Allergy: No unusual infections.  Allergy .   REST of 12 system review negative except as per HPI   Past Medical History  Diagnosis Date  . Allergic rhinitis   . Asthma   . Chicken pox     as a child  . Migraines     Family History  Problem Relation Age of Onset  . Diabetes Mellitus II Mother   . Thyroid disease    . Hypertension    . Thyroid disease      History   Social History  . Marital Status: Single    Spouse Name: N/A    Number of Children: N/A  . Years of Education: N/A   Social History Main Topics  . Smoking status: Never Smoker   . Smokeless tobacco: None  . Alcohol Use: No  . Drug Use: None  . Sexually Active: None   Other Topics Concern  . 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 Ascorbic Acid (VITAMIN C) 1000 MG tablet Take 1,000 mg by mouth daily.      . cetirizine (ZYRTEC) 10 MG tablet Take 10 mg by mouth daily.      . Cholecalciferol (VITAMIN D3) 1000 UNITS CAPS Take 1 capsule by mouth daily.      . Flaxseed MISC by Does not apply route. Whole ground      . fluticasone (VERAMYST) 27.5 MCG/SPRAY nasal spray Place 2 sprays into the nose 2 (two) times daily.      [name] Mushroom POWD 1 capsule by Does not apply route daily.      . Multiple Vitamins-Minerals (WHOLE FOOD MULTIVITAMIN) TABS Take 1 tablet by mouth 3 (three) times daily.      . Omega-3 Fatty Acids (OMEGA 3 PO) Take 1 capsule by mouth 2 (two) times daily.      . Probiotic Product (PROBIOTIC PO) Take 1 capsule by mouth daily. Green Vibrance      . vitamin E 400 UNIT capsule Take 400 Units by mouth 3 (three) times daily.      Marland Kitchen  WHEY PROTEIN PO Take 60 g by mouth.      Marland Kitchen PROAIR HFA 108 (90 BASE) MCG/ACT inhaler INHALE 2 PUFFS BY MOUTH FOUR TIMES DAILY AS NEEDED FOR WHEEZING  1 Inhaler  0  . QVAR 40 MCG/ACT inhaler INHALE 1 TO 2 PUFFS BY MOUTH TWICE DAILY AS DIRECTED  1 Inhaler  0    EXAM:  BP 124/84  Pulse 75  Temp 97.5 F (36.4 C) (Oral)  Ht 5' 1.75" (1.568 m)  Wt 179 lb (81.194 kg)  BMI 33.01 kg/m2  SpO2 97%  Body mass index is 33.01 kg/(m^2).  Physical Exam: Vital signs reviewed [id number] is a well-developed well-nourished alert cooperative   male who appears  stated age in no acute distress.  HEENT: normocephalic atraumatic , Eyes: PERRL EOM's full, conjunctiva clear, glasses  Nares: paten,t no deformity discharge or tenderness., Ears: no deformity EAC's clear TMs with normal landmarks. Mouth: clear OP, no lesions, edema.  Moist mucous membranes. Dentition in adequate repair. NECK: supple without masses, thyromegaly or bruits. CHEST/PULM:  Clear to  auscultation and percussion breath sounds equal no wheeze , rales or rhonchi. No chest wall deformities or tenderness. CV: PMI is nondisplaced, S1 S2 no gallops, murmurs, rubs. Peripheral pulses are full without delay.No JVD .  ABDOMEN: Bowel sounds normal nontender  No guard or rebound, no hepato splenomegal no CVA tenderness.   Small 2 fb umbi hernia non tender redudable at this time Extremtities:  No clubbing cyanosis or edema, no acute joint swelling or redness no focal atrophy crepitus right knee no swelling  RECtal: prostate 1 + no nodules or tendernses NEURO:  Oriented x3, cranial nerves 3-12 appear to be intact, no obvious focal weakness,gait within normal limits no abnormal reflexes or asymmetrical SKIN: No acute rashes normal turgor, color, no bruising or petechiae. PSYCH: Oriented, good eye contact, no obvious depression anxiety, cognition and judgment appear normal. LN: no cervical axillary inguinal adenopathy  Lab Results  Component Value Date   WBC 5.9 08/30/2009   HGB 15.7 08/30/2009   HCT 46.1 08/30/2009   PLT 223.0 08/30/2009   GLUCOSE 93 10/04/2009   CHOL 181 08/30/2009   TRIG 44.0 08/30/2009   HDL 78.70 08/30/2009   LDLCALC 94 08/30/2009   ALT 37 10/04/2009   AST 29 10/04/2009   NA 139 10/04/2009   K 4.6 10/04/2009   CL 103 10/04/2009   CREATININE 1.0 10/04/2009   BUN 17 10/04/2009   CO2 31 10/04/2009   TSH 0.93 08/30/2009   PSA 0.60 08/30/2009   HGBA1C 5.8 08/31/2007    ASSESSMENT AND PLAN:  Discussed the following assessment and plan:  1. Visit for preventive health examination  fluticasone (VERAMYST) 27.5 MCG/SPRAY nasal spray, cetirizine (ZYRTEC) 10 MG tablet, WHEY PROTEIN PO, Probiotic Product (PROBIOTIC PO), Flaxseed MISC, Multiple Vitamins-Minerals (WHOLE FOOD MULTIVITAMIN) TABS, Ascorbic Acid (VITAMIN C) 1000 MG tablet, Cholecalciferol (VITAMIN D3) 1000 UNITS CAPS, [place] Mushroom POWD, Omega-3 Fatty Acids (OMEGA 3 PO), vitamin E 400 UNIT capsule, Basic metabolic panel,  CBC with Differential, Hepatic function panel, Lipid panel, TSH, Hemoglobin A1c   declined flu vaccine  call for colonscopy that got delayed  2. ASTHMA  fluticasone (VERAMYST) 27.5 MCG/SPRAY nasal spray, cetirizine (ZYRTEC) 10 MG tablet, WHEY PROTEIN PO, Probiotic Product (PROBIOTIC PO), Flaxseed MISC, Multiple Vitamins-Minerals (WHOLE FOOD MULTIVITAMIN) TABS, Ascorbic Acid (VITAMIN C) 1000 MG tablet, Cholecalciferol (VITAMIN D3) 1000 UNITS CAPS, [place] Mushroom POWD, Omega-3 Fatty Acids (OMEGA 3 PO), vitamin E 400 UNIT capsule, Basic metabolic  panel, CBC with Differential, Hepatic function panel, Lipid panel, TSH, Hemoglobin A1c   advise daily controller med when at risk  3. HYPERGLYCEMIA  fluticasone (VERAMYST) 27.5 MCG/SPRAY nasal spray, cetirizine (ZYRTEC) 10 MG tablet, WHEY PROTEIN PO, Probiotic Product (PROBIOTIC PO), Flaxseed MISC, Multiple Vitamins-Minerals (WHOLE FOOD MULTIVITAMIN) TABS, Ascorbic Acid (VITAMIN C) 1000 MG tablet, Cholecalciferol (VITAMIN D3) 1000 UNITS CAPS, [place] Mushroom POWD, Omega-3 Fatty Acids (OMEGA 3 PO), vitamin E 400 UNIT capsule, Basic metabolic panel, CBC with Differential, Hepatic function panel, Lipid panel, TSH, Hemoglobin A1c   fam hx check a1c today lsi advised  4. ALLERGIC RHINITIS  fluticasone (VERAMYST) 27.5 MCG/SPRAY nasal spray, cetirizine (ZYRTEC) 10 MG tablet, WHEY PROTEIN PO, Probiotic Product (PROBIOTIC PO), Flaxseed MISC, Multiple Vitamins-Minerals (WHOLE FOOD MULTIVITAMIN) TABS, Ascorbic Acid (VITAMIN C) 1000 MG tablet, Cholecalciferol (VITAMIN D3) 1000 UNITS CAPS, [place] Mushroom POWD, Omega-3 Fatty Acids (OMEGA 3 PO), vitamin E 400 UNIT capsule, Basic metabolic panel, CBC with Differential, Hepatic function panel, Lipid panel, TSH, Hemoglobin A1c  5. Umbilical hernia  Ambulatory referral to General Surgery   small since birth recent soreness / sx off and on   6. Knee pain, right     remote acl repair now limiting exercise disc options exercises may  want to see [facility]  7. MIGRAINE HEADACHE     Declined flu vaccine  To set up colonoscopy screen  . Asthma control  Patient Care Team: Madelin Headings, MD as PCP - General Patient Instructions  Contact the [facility] about rescheduling the colonsocopy.  Consider seeing  In sports  medicine [facility] about the [facility] issue. Sometimes exercises can help or change in shoes.  Will contact you about surgery consult about the small umbilical hernia . Will notify you  of labs when available. conctact [place] about medication refills when needed. Advise take controller med every day in risky times or seasons.    Preventive Care for Adults, Male A healthy lifestyle and preventive care can promote health and wellness. Preventive health guidelines for men include the following key practices:  A routine yearly physical is a good way to check with your caregiver about your health and preventative screening. It is a chance to share any concerns and updates on your health, and to receive a thorough exam.  Visit your dentist for a routine exam and preventative care every 6 months. Brush your teeth twice a day and floss once a day. Good oral hygiene prevents tooth decay and gum disease.  The frequency of eye exams is based on your age, health, family medical history, use of contact lenses, and other factors. Follow your caregiver's recommendations for frequency of eye exams.  Eat a healthy diet. Foods like vegetables, fruits, whole grains, low-fat dairy products, and lean protein foods contain the nutrients you need without too many calories. Decrease your intake of foods high in solid fats, added sugars, and salt. Eat the right amount of calories for you.Get information about a proper diet from your caregiver, if necessary.  Regular physical exercise is one of the most important things you can do for your health. Most adults should get at least 150 minutes of  moderate-intensity exercise (any activity that increases your heart rate and causes you to sweat) each week. In addition, most adults need muscle-strengthening exercises on 2 or more days a week.  Maintain a healthy weight. The body mass index (BMI) is a screening tool to identify possible weight problems. It  provides an estimate of body fat based on height and weight. Your caregiver can help determine your BMI, and can help you achieve or maintain a healthy weight.For adults 20 years and older:  A BMI below 18.5 is considered underweight.  A BMI of 18.5 to 24.9 is normal.  A BMI of 25 to 29.9 is considered overweight.  A BMI of 30 and above is considered obese.  Maintain normal blood lipids and cholesterol levels by exercising and minimizing your intake of saturated fat. Eat a balanced diet with plenty of fruit and vegetables. Blood tests for lipids and cholesterol should begin at age 47 and be repeated every 5 years. If your lipid or cholesterol levels are high, you are over 50, or you are a high risk for heart disease, you may need your cholesterol levels checked more frequently.Ongoing high lipid and cholesterol levels should be treated with medicines if diet and exercise are not effective.  If you smoke, find out from your caregiver how to quit. If you do not use tobacco, do not start.  If you choose to drink alcohol, do not exceed 2 drinks per day. One drink is considered to be 12 ounces (355 mL) of beer, 5 ounces (148 mL) of wine, or 1.5 ounces (44 mL) of liquor.  Avoid use of street drugs. Do not share needles with anyone. Ask for help if you need support or instructions about stopping the use of drugs.  High blood pressure causes heart disease and increases the risk of stroke. Your blood pressure should be checked at least every 1 to 2 years. Ongoing high blood pressure should be treated with medicines, if weight loss and exercise are not effective.  If you are 54 to 55 years old,  ask your caregiver if you should take aspirin to prevent heart disease.  Diabetes screening involves taking a blood sample to check your fasting blood sugar level. This should be done once every 3 years, after age 69, if you are within normal weight and without risk factors for diabetes. Testing should be considered at a younger age or be carried out more frequently if you are overweight and have at least 1 risk factor for diabetes.  Colorectal cancer can be detected and often prevented. Most routine colorectal cancer screening begins at the age of 96 and continues through age 80. However, your caregiver may recommend screening at an earlier age if you have risk factors for colon cancer. On a yearly basis, your caregiver may provide home test kits to check for hidden blood in the stool. Use of a small camera at the end of a tube, to directly examine the colon (sigmoidoscopy or colonoscopy), can detect the earliest forms of colorectal cancer. Talk to your caregiver about this at age 42, when routine screening begins. Direct examination of the colon should be repeated every 5 to 10 years through age 52, unless early forms of pre-cancerous polyps or small growths are found.  Hepatitis C blood testing is recommended for all people born from [birth year] through 1965 and any individual with known risks for hepatitis C.  Practice safe sex. Use condoms and avoid high-risk sexual practices to reduce the spread of sexually transmitted infections (STIs). STIs include gonorrhea, chlamydia, syphilis, trichomonas, herpes, HPV, and human immunodeficiency virus (HIV). Herpes, HIV, and HPV are viral illnesses that have no cure. They can result in disability, cancer, and death.  A one-time screening for abdominal aortic aneurysm (AAA) and surgical repair of large AAAs by sound  wave imaging (ultrasonography) is recommended for ages 13 to 67 years who are current or former smokers.  Healthy men should no longer receive  prostate-specific antigen (PSA) blood tests as part of routine cancer screening. Consult with your caregiver about prostate cancer screening.  Testicular cancer screening is not recommended for adult males who have no symptoms. Screening includes self-exam, caregiver exam, and other screening tests. Consult with your caregiver about any symptoms you have or any concerns you have about testicular cancer.  Use sunscreen with skin protection factor (SPF) of 30 or more. Apply sunscreen liberally and repeatedly throughout the day. You should seek shade when your shadow is shorter than you. Protect yourself by wearing long sleeves, pants, a wide-brimmed hat, and sunglasses year round, whenever you are outdoors.  Once a month, do a whole body skin exam, using a mirror to look at the skin on your back. Notify your caregiver of new moles, moles that have irregular borders, moles that are larger than a pencil eraser, or moles that have changed in shape or color.  Stay current with required immunizations.  Influenza. You need a dose every fall (or winter). The composition of the flu vaccine changes each year, so being vaccinated once is not enough.  Pneumococcal polysaccharide. You need 1 to 2 doses if you smoke cigarettes or if you have certain chronic medical conditions. You need 1 dose at age 97 (or older) if you have never been vaccinated.  Tetanus, diphtheria, pertussis (Tdap, Td). Get 1 dose of Tdap vaccine if you are younger than age 28 years, are over 40 and have contact with an infant, are a Research scientist (physical sciences), or simply want to be protected from whooping cough. After that, you need a Td booster dose every 10 years. Consult your caregiver if you have not had at least 3 tetanus and diphtheria-containing shots sometime in your life or have a deep or dirty wound.  HPV. This vaccine is recommended for males 13 through 55 years of age. This vaccine may be given to men 22 through 55 years of age who have not  completed the 3 dose series. It is recommended for men through age 24 who have sex with men or whose immune system is weakened because of HIV infection, other illness, or medications. The vaccine is given in 3 doses over 6 months.  Measles, mumps, rubella (MMR). You need at least 1 dose of MMR if you were born in 1957 or later. You may also need a 2nd dose.  Meningococcal. If you are age 31 to 109 years and a Orthoptist living in a residence hall, or have one of several medical conditions, you need to get vaccinated against meningococcal disease. You may also need additional booster doses.  Zoster (shingles). If you are age 31 years or older, you should get this vaccine.  Varicella (chickenpox). If you have never had chickenpox or you were vaccinated but received only 1 dose, talk to your caregiver to find out if you need this vaccine.  Hepatitis A. You need this vaccine if you have a specific risk factor for hepatitis A virus infection, or you simply wish to be protected from this disease. The vaccine is usually given as 2 doses, 6 to 18 months apart.  Hepatitis B. You need this vaccine if you have a specific risk factor for hepatitis B virus infection or you simply wish to be protected from this disease. The vaccine is given in 3 doses, usually over 6 months.  Preventative Service / Frequency Ages 29 to 71  Blood pressure check.** / Every 1 to 2 years.  Lipid and cholesterol check.** / Every 5 years beginning at age 20.  Hepatitis C blood test.** / For any individual with known risks for hepatitis C.  Skin self-exam. / Monthly.  Influenza immunization.** / Every year.  Pneumococcal polysaccharide immunization.** / 1 to 2 doses if you smoke cigarettes or if you have certain chronic medical conditions.  Tetanus, diphtheria, pertussis (Tdap,Td) immunization. / A one-time dose of Tdap vaccine. After that, you need a Td booster dose every 10 years.  HPV immunization. / 3  doses over 6 months, if 26 and younger.  Measles, mumps, rubella (MMR) immunization. / You need at least 1 dose of MMR if you were born in 1957 or later. You may also need a 2nd dose.  Meningococcal immunization. / 1 dose if you are age 68 to 58 years and a Orthoptist living in a residence hall, or have one of several medical conditions, you need to get vaccinated against meningococcal disease. You may also need additional booster doses.  Varicella immunization.** / Consult your caregiver.  Hepatitis A immunization.** / Consult your caregiver. 2 doses, 6 to 18 months apart.  Hepatitis B immunization.** / Consult your caregiver. 3 doses usually over 6 months. Ages 79 to 82  Blood pressure check.** / Every 1 to 2 years.  Lipid and cholesterol check.** / Every 5 years beginning at age 53.  Fecal occult blood test (FOBT) of stool. / Every year beginning at age 40 and continuing until age 16. You may not have to do this test if you get colonoscopy every 10 years.  Flexible sigmoidoscopy** or colonoscopy.** / Every 5 years for a flexible sigmoidoscopy or every 10 years for a colonoscopy beginning at age 17 and continuing until age 42.  Hepatitis C blood test.** / For all people born from [birth year] through 1965 and any individual with known risks for hepatitis C.  Skin self-exam. / Monthly.  Influenza immunization.** / Every year.  Pneumococcal polysaccharide immunization.** / 1 to 2 doses if you smoke cigarettes or if you have certain chronic medical conditions.  Tetanus, diphtheria, pertussis (Tdap/Td) immunization.** / A one-time dose of Tdap vaccine. After that, you need a Td booster dose every 10 years.  Measles, mumps, rubella (MMR) immunization. / You need at least 1 dose of MMR if you were born in 1957 or later. You may also need a 2nd dose.  Varicella immunization.**/ Consult your caregiver.  Meningococcal immunization.** / Consult your caregiver.  Hepatitis A  immunization.** / Consult your caregiver. 2 doses, 6 to 18 months apart.  Hepatitis B immunization.** / Consult your caregiver. 3 doses, usually over 6 months. Ages 2 and over  Blood pressure check.** / Every 1 to 2 years.  Lipid and cholesterol check.**/ Every 5 years beginning at age 109.  Fecal occult blood test (FOBT) of stool. / Every year beginning at age 41 and continuing until age 27. You may not have to do this test if you get colonoscopy every 10 years.  Flexible sigmoidoscopy** or colonoscopy.** / Every 5 years for a flexible sigmoidoscopy or every 10 years for a colonoscopy beginning at age 74 and continuing until age 48.  Hepatitis C blood test.** / For all people born from [birth year] through 1963 and any individual with known risks for hepatitis C.  Abdominal aortic aneurysm (AAA) screening.** / A one-time screening for ages 11 to 29 years  who are current or former smokers.  Skin self-exam. / Monthly.  Influenza immunization.** / Every year.  Pneumococcal polysaccharide immunization.** / 1 dose at age 29 (or older) if you have never been vaccinated.  Tetanus, diphtheria, pertussis (Tdap, Td) immunization. / A one-time dose of Tdap vaccine if you are over 65 and have contact with an infant, are a Research scientist (physical sciences), or simply want to be protected from whooping cough. After that, you need a Td booster dose every 10 years.  Varicella immunization. ** / Consult your caregiver.  Meningococcal immunization.** / Consult your caregiver.  Hepatitis A immunization. ** / Consult your caregiver. 2 doses, 6 to 18 months apart.  Hepatitis B immunization.** / Check with your caregiver. 3 doses, usually over 6 months. **Family history and personal history of risk and conditions may change your caregiver's recommendations. Document Released: 04/30/2001 Document Revised: 05/27/2011 Document Reviewed: 07/30/2010 [facility] Patient Information 2013 [place], [place].    Neta Mends. Sirenia Whitis  M.D.

## 2012-03-23 NOTE — Patient Instructions (Addendum)
Contact the [facility] department about rescheduling the colonsocopy.  Consider seeing  In sports  medicine Dr [facility] about the [facility] issue. Sometimes exercises can help or change in shoes.  Will contact you about surgery consult about the small umbilical hernia . Will notify you  of labs when available. conctact [place] about medication refills when needed. Advise take controller med every day in risky times or seasons.    Preventive Care for Adults, Male A healthy lifestyle and preventive care can promote health and wellness. Preventive health guidelines for men include the following key practices:  A routine yearly physical is a good way to check with your caregiver about your health and preventative screening. It is a chance to share any concerns and updates on your health, and to receive a thorough exam.  Visit your dentist for a routine exam and preventative care every 6 months. Brush your teeth twice a day and floss once a day. Good oral hygiene prevents tooth decay and gum disease.  The frequency of eye exams is based on your age, health, family medical history, use of contact lenses, and other factors. Follow your caregiver's recommendations for frequency of eye exams.  Eat a healthy diet. Foods like vegetables, fruits, whole grains, low-fat dairy products, and lean protein foods contain the nutrients you need without too many calories. Decrease your intake of foods high in solid fats, added sugars, and salt. Eat the right amount of calories for you.Get information about a proper diet from your caregiver, if necessary.  Regular physical exercise is one of the most important things you can do for your health. Most adults should get at least 150 minutes of moderate-intensity exercise (any activity that increases your heart rate and causes you to sweat) each week. In addition, most adults need muscle-strengthening exercises on 2 or more days a week.  Maintain a healthy  weight. The body mass index (BMI) is a screening tool to identify possible weight problems. It provides an estimate of body fat based on height and weight. Your caregiver can help determine your BMI, and can help you achieve or maintain a healthy weight.For adults 20 years and older:  A BMI below 18.5 is considered underweight.  A BMI of 18.5 to 24.9 is normal.  A BMI of 25 to 29.9 is considered overweight.  A BMI of 30 and above is considered obese.  Maintain normal blood lipids and cholesterol levels by exercising and minimizing your intake of saturated fat. Eat a balanced diet with plenty of fruit and vegetables. Blood tests for lipids and cholesterol should begin at age 11 and be repeated every 5 years. If your lipid or cholesterol levels are high, you are over 50, or you are a high risk for heart disease, you may need your cholesterol levels checked more frequently.Ongoing high lipid and cholesterol levels should be treated with medicines if diet and exercise are not effective.  If you smoke, find out from your caregiver how to quit. If you do not use tobacco, do not start.  If you choose to drink alcohol, do not exceed 2 drinks per day. One drink is considered to be 12 ounces (355 mL) of beer, 5 ounces (148 mL) of wine, or 1.5 ounces (44 mL) of liquor.  Avoid use of street drugs. Do not share needles with anyone. Ask for help if you need support or instructions about stopping the use of drugs.  High blood pressure causes heart disease and increases  the risk of stroke. Your blood pressure should be checked at least every 1 to 2 years. Ongoing high blood pressure should be treated with medicines, if weight loss and exercise are not effective.  If you are 72 to 55 years old, ask your caregiver if you should take aspirin to prevent heart disease.  Diabetes screening involves taking a blood sample to check your fasting blood sugar level. This should be done once every 3 years, after age 24,  if you are within normal weight and without risk factors for diabetes. Testing should be considered at a younger age or be carried out more frequently if you are overweight and have at least 1 risk factor for diabetes.  Colorectal cancer can be detected and often prevented. Most routine colorectal cancer screening begins at the age of 50 and continues through age 84. However, your caregiver may recommend screening at an earlier age if you have risk factors for colon cancer. On a yearly basis, your caregiver may provide home test kits to check for hidden blood in the stool. Use of a small camera at the end of a tube, to directly examine the colon (sigmoidoscopy or colonoscopy), can detect the earliest forms of colorectal cancer. Talk to your caregiver about this at age 28, when routine screening begins. Direct examination of the colon should be repeated every 5 to 10 years through age 87, unless early forms of pre-cancerous polyps or small growths are found.  Hepatitis C blood testing is recommended for all people born from [birth year] through 1963 and any individual with known risks for hepatitis C.  Practice safe sex. Use condoms and avoid high-risk sexual practices to reduce the spread of sexually transmitted infections (STIs). STIs include gonorrhea, chlamydia, syphilis, trichomonas, herpes, HPV, and human immunodeficiency virus (HIV). Herpes, HIV, and HPV are viral illnesses that have no cure. They can result in disability, cancer, and death.  A one-time screening for abdominal aortic aneurysm (AAA) and surgical repair of large AAAs by sound wave imaging (ultrasonography) is recommended for ages 50 to 7 years who are current or former smokers.  Healthy men should no longer receive prostate-specific antigen (PSA) blood tests as part of routine cancer screening. Consult with your caregiver about prostate cancer screening.  Testicular cancer screening is not recommended for adult males who have no symptoms.  Screening includes self-exam, caregiver exam, and other screening tests. Consult with your caregiver about any symptoms you have or any concerns you have about testicular cancer.  Use sunscreen with skin protection factor (SPF) of 30 or more. Apply sunscreen liberally and repeatedly throughout the day. You should seek shade when your shadow is shorter than you. Protect yourself by wearing long sleeves, pants, a wide-brimmed hat, and sunglasses year round, whenever you are outdoors.  Once a month, do a whole body skin exam, using a mirror to look at the skin on your back. Notify your caregiver of new moles, moles that have irregular borders, moles that are larger than a pencil eraser, or moles that have changed in shape or color.  Stay current with required immunizations.  Influenza. You need a dose every fall (or winter). The composition of the flu vaccine changes each year, so being vaccinated once is not enough.  Pneumococcal polysaccharide. You need 1 to 2 doses if you smoke cigarettes or if you have certain chronic medical conditions. You need 1 dose at age 47 (or older) if you have never been vaccinated.  Tetanus, diphtheria, pertussis (Tdap, Td). Get  1 dose of Tdap vaccine if you are younger than age 79 years, are over 76 and have contact with an infant, are a Research scientist (physical sciences), or simply want to be protected from whooping cough. After that, you need a Td booster dose every 10 years. Consult your caregiver if you have not had at least 3 tetanus and diphtheria-containing shots sometime in your life or have a deep or dirty wound.  HPV. This vaccine is recommended for males 13 through 55 years of age. This vaccine may be given to men 22 through 55 years of age who have not completed the 3 dose series. It is recommended for men through age 83 who have sex with men or whose immune system is weakened because of HIV infection, other illness, or medications. The vaccine is given in 3 doses over 6  months.  Measles, mumps, rubella (MMR). You need at least 1 dose of MMR if you were born in 1957 or later. You may also need a 2nd dose.  Meningococcal. If you are age 64 to 64 years and a Orthoptist living in a residence hall, or have one of several medical conditions, you need to get vaccinated against meningococcal disease. You may also need additional booster doses.  Zoster (shingles). If you are age 55 years or older, you should get this vaccine.  Varicella (chickenpox). If you have never had chickenpox or you were vaccinated but received only 1 dose, talk to your caregiver to find out if you need this vaccine.  Hepatitis A. You need this vaccine if you have a specific risk factor for hepatitis A virus infection, or you simply wish to be protected from this disease. The vaccine is usually given as 2 doses, 6 to 18 months apart.  Hepatitis B. You need this vaccine if you have a specific risk factor for hepatitis B virus infection or you simply wish to be protected from this disease. The vaccine is given in 3 doses, usually over 6 months. Preventative Service / Frequency Ages 65 to 76  Blood pressure check.** / Every 1 to 2 years.  Lipid and cholesterol check.** / Every 5 years beginning at age 19.  Hepatitis C blood test.** / For any individual with known risks for hepatitis C.  Skin self-exam. / Monthly.  Influenza immunization.** / Every year.  Pneumococcal polysaccharide immunization.** / 1 to 2 doses if you smoke cigarettes or if you have certain chronic medical conditions.  Tetanus, diphtheria, pertussis (Tdap,Td) immunization. / A one-time dose of Tdap vaccine. After that, you need a Td booster dose every 10 years.  HPV immunization. / 3 doses over 6 months, if 26 and younger.  Measles, mumps, rubella (MMR) immunization. / You need at least 1 dose of MMR if you were born in 1957 or later. You may also need a 2nd dose.  Meningococcal immunization. / 1 dose  if you are age 15 to 31 years and a Orthoptist living in a residence hall, or have one of several medical conditions, you need to get vaccinated against meningococcal disease. You may also need additional booster doses.  Varicella immunization.** / Consult your caregiver.  Hepatitis A immunization.** / Consult your caregiver. 2 doses, 6 to 18 months apart.  Hepatitis B immunization.** / Consult your caregiver. 3 doses usually over 6 months. Ages 50 to 19  Blood pressure check.** / Every 1 to 2 years.  Lipid and cholesterol check.** / Every 5 years beginning at age 41.  Fecal  occult blood test (FOBT) of stool. / Every year beginning at age 6 and continuing until age 74. You may not have to do this test if you get colonoscopy every 10 years.  Flexible sigmoidoscopy** or colonoscopy.** / Every 5 years for a flexible sigmoidoscopy or every 10 years for a colonoscopy beginning at age 45 and continuing until age 78.  Hepatitis C blood test.** / For all people born from [birth year] through 1965 and any individual with known risks for hepatitis C.  Skin self-exam. / Monthly.  Influenza immunization.** / Every year.  Pneumococcal polysaccharide immunization.** / 1 to 2 doses if you smoke cigarettes or if you have certain chronic medical conditions.  Tetanus, diphtheria, pertussis (Tdap/Td) immunization.** / A one-time dose of Tdap vaccine. After that, you need a Td booster dose every 10 years.  Measles, mumps, rubella (MMR) immunization. / You need at least 1 dose of MMR if you were born in 1957 or later. You may also need a 2nd dose.  Varicella immunization.**/ Consult your caregiver.  Meningococcal immunization.** / Consult your caregiver.  Hepatitis A immunization.** / Consult your caregiver. 2 doses, 6 to 18 months apart.  Hepatitis B immunization.** / Consult your caregiver. 3 doses, usually over 6 months. Ages 65 and over  Blood pressure check.** / Every 1 to 2  years.  Lipid and cholesterol check.**/ Every 5 years beginning at age 72.  Fecal occult blood test (FOBT) of stool. / Every year beginning at age 51 and continuing until age 14. You may not have to do this test if you get colonoscopy every 10 years.  Flexible sigmoidoscopy** or colonoscopy.** / Every 5 years for a flexible sigmoidoscopy or every 10 years for a colonoscopy beginning at age 96 and continuing until age 13.  Hepatitis C blood test.** / For all people born from [birth year] through 1963 and any individual with known risks for hepatitis C.  Abdominal aortic aneurysm (AAA) screening.** / A one-time screening for ages 78 to 19 years who are current or former smokers.  Skin self-exam. / Monthly.  Influenza immunization.** / Every year.  Pneumococcal polysaccharide immunization.** / 1 dose at age 85 (or older) if you have never been vaccinated.  Tetanus, diphtheria, pertussis (Tdap, Td) immunization. / A one-time dose of Tdap vaccine if you are over 65 and have contact with an infant, are a Research scientist (physical sciences), or simply want to be protected from whooping cough. After that, you need a Td booster dose every 10 years.  Varicella immunization. ** / Consult your caregiver.  Meningococcal immunization.** / Consult your caregiver.  Hepatitis A immunization. ** / Consult your caregiver. 2 doses, 6 to 18 months apart.  Hepatitis B immunization.** / Check with your caregiver. 3 doses, usually over 6 months. **Family history and personal history of risk and conditions may change your caregiver's recommendations. Document Released: 04/30/2001 Document Revised: 05/27/2011 Document Reviewed: 07/30/2010 [facility] Patient Information 2013 [place], [place].

## 2012-03-25 NOTE — Progress Notes (Signed)
Quick Note:  Called and spoke with pt and pt is aware. ______ 

## 2012-03-30 ENCOUNTER — Ambulatory Visit (INDEPENDENT_AMBULATORY_CARE_PROVIDER_SITE_OTHER): Payer: BC Managed Care – PPO | Admitting: General Surgery

## 2012-03-30 ENCOUNTER — Encounter (INDEPENDENT_AMBULATORY_CARE_PROVIDER_SITE_OTHER): Payer: Self-pay | Admitting: General Surgery

## 2012-03-30 VITALS — BP 112/64 | HR 76 | Resp 14 | Ht 61.75 in | Wt 180.0 lb

## 2012-03-30 DIAGNOSIS — K429 Umbilical hernia without obstruction or gangrene: Secondary | ICD-10-CM

## 2012-03-30 NOTE — Progress Notes (Signed)
Patient ID: [name], male   DOB: 1955-11-05, 57 y.o.   MRN: [id number]  Chief Complaint  Patient presents with  . Umbilical Hernia    HPI [name] is a 57 y.o. male.   HPI 57 yo AAM referred by Dr [name] for an umbilical hernia. The patient states he has had an umbilical hernia ever since birth. He states however it recently started causing some intermittent discomfort over the past year or so. It mainly bothersome when he is working out at [employer]. He states that certain exercises will cause some pain and discomfort around his umbilicus. He states that there'll be a small lump at his bellybutton and the skin will get a little bit red and swollen. He states the discomfort will last for about 24-48 hours. He denies any nausea, vomiting, diarrhea or constipation. He denies any weight change. He denies any prior abdominal surgery. Past Medical History  Diagnosis Date  . Allergic rhinitis   . Asthma   . Chicken pox     as a child  . Migraines     Past Surgical History  Procedure Date  . Nasal sinus surgery 1999  . Arthroscopic repair acl 1991    Family History  Problem Relation Age of Onset  . Diabetes Mellitus II Mother   . Thyroid disease    . Hypertension    . Thyroid disease      Social History History  Substance Use Topics  . Smoking status: Never Smoker   . Smokeless tobacco: Not on file  . Alcohol Use: No    No Known Allergies  Current Outpatient Prescriptions  Medication Sig Dispense Refill  . Ascorbic Acid (VITAMIN C) 1000 MG tablet Take 1,000 mg by mouth daily.      . cetirizine (ZYRTEC) 10 MG tablet Take 10 mg by mouth daily.      . Cholecalciferol (VITAMIN D3) 1000 UNITS CAPS Take 1 capsule by mouth daily.      . Flaxseed MISC by Does not apply route. Whole ground      . fluticasone (VERAMYST) 27.5 MCG/SPRAY nasal spray Place 2 sprays into the nose 2 (two) times daily.      [name] Mushroom POWD 1 capsule by Does not apply route daily.        . Multiple Vitamins-Minerals (WHOLE FOOD MULTIVITAMIN) TABS Take 1 tablet by mouth 3 (three) times daily.      . Omega-3 Fatty Acids (OMEGA 3 PO) Take 1 capsule by mouth 2 (two) times daily.      Marland Kitchen PROAIR HFA 108 (90 BASE) MCG/ACT inhaler INHALE 2 PUFFS BY MOUTH FOUR TIMES DAILY AS NEEDED FOR WHEEZING  1 Inhaler  0  . Probiotic Product (PROBIOTIC PO) Take 1 capsule by mouth daily. Green Vibrance      . QVAR 40 MCG/ACT inhaler INHALE 1 TO 2 PUFFS BY MOUTH TWICE DAILY AS DIRECTED  1 Inhaler  0  . vitamin E 400 UNIT capsule Take 400 Units by mouth 3 (three) times daily.      . WHEY PROTEIN PO Take 60 g by mouth.        Review of Systems Review of Systems  Constitutional: Negative for fever, chills, appetite change and unexpected weight change.  HENT: Negative for congestion and trouble swallowing.   Eyes: Negative for visual disturbance.  Respiratory: Positive for wheezing (with an asthma attack). Negative for chest tightness and shortness of breath.   Cardiovascular: Negative for chest pain and  leg swelling.       No PND, no orthopnea, no DOE  Gastrointestinal:       See HPI  Genitourinary: Negative for dysuria and hematuria.  Musculoskeletal: Negative.   Skin: Negative for rash.  Neurological: Negative for seizures and speech difficulty.  Hematological: Does not bruise/bleed easily.  Psychiatric/Behavioral: Negative for behavioral problems and confusion.    Blood pressure 112/64, pulse 76, resp. rate 14, height 5' 1.75" (1.568 m), weight 180 lb (81.647 kg).  Physical Exam Physical Exam  Vitals reviewed. Constitutional: He is oriented to person, place, and time. He appears well-developed and well-nourished. No distress.  HENT:  Head: Normocephalic and atraumatic.  Right Ear: External ear normal.  Left Ear: External ear normal.  Eyes: Conjunctivae normal are normal. No scleral icterus.  Neck: Normal range of motion. Neck supple. No tracheal deviation present. No thyromegaly  present.  Cardiovascular: Normal rate, regular rhythm and normal heart sounds.   Pulmonary/Chest: Effort normal and breath sounds normal. No respiratory distress. He has no wheezes.  Abdominal: Soft. He exhibits no distension. There is no tenderness. There is no rebound.       Small reducible umbilical hernia, about 1.5cm defect. nontender  Musculoskeletal: He exhibits no edema and no tenderness.  Lymphadenopathy:    He has no cervical adenopathy.  Neurological: He is alert and oriented to person, place, and time.  Skin: Skin is warm and dry. No rash noted. He is not diaphoretic. No erythema. No pallor.  Psychiatric: He has a normal mood and affect. His behavior is normal. Judgment and thought content normal.    Data Reviewed Dr Rosezella [place] note  Assessment    Umbilical hernia Asthma obesity    Plan    We discussed the etiology of  umbilical hernias. We discussed the signs and symptoms of incarceration and strangulation. The patient was given educational material. I also drew diagrams.  We discussed nonoperative and operative management. With respect to operative management, we discussed open repair. We discussed the risk and benefits of surgery including but not limited to bleeding, infection, injury to surrounding structures, hernia recurrence, mesh complications, hematoma/seroma formation, blood clot formation, urinary retention, post operative ileus, general anesthesia risk, long-term abdominal pain. We discussed that this procedure can be quite uncomfortable and difficult to recover from based on how the mesh is secured to the abdominal wall. We discussed the importance of avoiding heavy lifting and straining for a period of 6 weeks.  The patient has elected to have OPEN UMBILICAL HERNIA SURGERY WITH MESH; however, he needs to review his schedule prior to scheduling surgery. He was instructed to call our office to schedule surgery when he is ready.   Mary Sella. Andrey Campanile, MD,  FACS General, Bariatric, & Minimally Invasive Surgery [facility], [place]           [facility] M 03/30/2012, 11:12 AM

## 2012-03-30 NOTE — Patient Instructions (Signed)
Call our office to schedule surgery or if you have additional questions

## 2012-04-07 ENCOUNTER — Telehealth (INDEPENDENT_AMBULATORY_CARE_PROVIDER_SITE_OTHER): Payer: Self-pay | Admitting: General Surgery

## 2012-04-07 NOTE — Telephone Encounter (Signed)
Message copied by Liliana Cline on Tue Apr 07, 2012  8:23 AM ------      Message from: Zacarias Pontes      Created: Mon Apr 06, 2012 11:30 AM       Dick Hark,pt has called twice looking to speak with Dr Andrey Campanile.He was given a sx date and pt told them that he wanted to wait until June to have his sx and they told him he would have to r/s for a reck.He is very upset because he said that Dr Migdalia Dk told him he could sched sx at any time and did not tell him he would have to pay for another visit.He wants a call back please

## 2012-04-07 NOTE — Telephone Encounter (Signed)
Spoke with patient. Made him aware that we have to see him for a preop visit if it has been several months since we had seen him in the office. He was not happy about this but I made him aware that it has to be done. He also wants June 6th for a surgery date and stated if he did not get that date he could not have surgery. I made him aware our schedule is not out that far but I transferred him to the scheduling department so they are aware the date he wants once the schedule opens. I did make him aware that Dr Andrey Campanile does not operate everyday and if he is not in surgery he may not be able to get a specific date. He will call with any other questions.

## 2013-06-01 ENCOUNTER — Telehealth: Payer: Self-pay | Admitting: Internal Medicine

## 2013-06-01 NOTE — Telephone Encounter (Signed)
Ok to refill until June

## 2013-06-01 NOTE — Telephone Encounter (Signed)
Pt is needing new rx QVAR 40 MCG/ACT inhaler and proair hfa 108 (90 base) mcg/act inhaler, sent to [facility] on [address].

## 2013-06-01 NOTE — Telephone Encounter (Signed)
Patient was last here 03/2012.  Has future CPE in June.  This was last filled by you in 2013.  Please advise.

## 2013-06-02 MED ORDER — BECLOMETHASONE DIPROPIONATE 40 MCG/ACT IN AERS: INHALATION_SPRAY | RESPIRATORY_TRACT | Status: DC

## 2013-06-02 NOTE — Telephone Encounter (Signed)
Sent in by e-scribe.

## 2013-06-04 ENCOUNTER — Telehealth: Payer: Self-pay | Admitting: Internal Medicine

## 2013-06-04 MED ORDER — ALBUTEROL SULFATE HFA 108 (90 BASE) MCG/ACT IN AERS: INHALATION_SPRAY | RESPIRATORY_TRACT | Status: DC

## 2013-06-04 NOTE — Telephone Encounter (Signed)
Pt states PROAIR HFA 108 (90 BASE) MCG/ACT inhaler was not sent in as a re-fill sent to [facility] on [address].

## 2013-06-04 NOTE — Telephone Encounter (Signed)
Sent in by e-scribe. 

## 2013-09-13 ENCOUNTER — Encounter: Payer: Self-pay | Admitting: Internal Medicine

## 2013-09-13 ENCOUNTER — Ambulatory Visit (INDEPENDENT_AMBULATORY_CARE_PROVIDER_SITE_OTHER): Payer: BC Managed Care – PPO | Admitting: Internal Medicine

## 2013-09-13 VITALS — BP 128/90 | Temp 98.1°F | Ht 62.0 in | Wt 182.0 lb

## 2013-09-13 DIAGNOSIS — G43909 Migraine, unspecified, not intractable, without status migrainosus: Secondary | ICD-10-CM

## 2013-09-13 DIAGNOSIS — J45909 Unspecified asthma, uncomplicated: Secondary | ICD-10-CM

## 2013-09-13 DIAGNOSIS — IMO0002 Reserved for concepts with insufficient information to code with codable children: Secondary | ICD-10-CM

## 2013-09-13 DIAGNOSIS — K224 Dyskinesia of esophagus: Secondary | ICD-10-CM

## 2013-09-13 DIAGNOSIS — R0789 Other chest pain: Secondary | ICD-10-CM

## 2013-09-13 DIAGNOSIS — Z0000 Encounter for general adult medical examination without abnormal findings: Secondary | ICD-10-CM

## 2013-09-13 DIAGNOSIS — Z23 Encounter for immunization: Secondary | ICD-10-CM

## 2013-09-13 DIAGNOSIS — R7309 Other abnormal glucose: Secondary | ICD-10-CM

## 2013-09-13 DIAGNOSIS — Z136 Encounter for screening for cardiovascular disorders: Secondary | ICD-10-CM

## 2013-09-13 DIAGNOSIS — Z1211 Encounter for screening for malignant neoplasm of colon: Secondary | ICD-10-CM

## 2013-09-13 DIAGNOSIS — E669 Obesity, unspecified: Secondary | ICD-10-CM | POA: Insufficient documentation

## 2013-09-13 DIAGNOSIS — Z9181 History of falling: Secondary | ICD-10-CM

## 2013-09-13 DIAGNOSIS — K429 Umbilical hernia without obstruction or gangrene: Secondary | ICD-10-CM

## 2013-09-13 DIAGNOSIS — T148XXA Other injury of unspecified body region, initial encounter: Secondary | ICD-10-CM

## 2013-09-13 LAB — LIPID PANEL
CHOLESTEROL: 155 mg/dL (ref 0–200)
HDL: 72.2 mg/dL (ref >39.00)
LDL Cholesterol: 77 mg/dL (ref 0–99)
NonHDL: 82.8
Total CHOL/HDL Ratio: 2
Triglycerides: 31 mg/dL (ref 0.0–149.0)
VLDL: 6.2 mg/dL (ref 0.0–40.0)

## 2013-09-13 LAB — HEPATIC FUNCTION PANEL
ALT: 54 U/L — ABNORMAL HIGH (ref 0–53)
AST: 35 U/L (ref 0–37)
Albumin: 4 g/dL (ref 3.5–5.2)
Alkaline Phosphatase: 60 U/L (ref 39–117)
Bilirubin, Direct: 0.1 mg/dL (ref 0.0–0.3)
TOTAL PROTEIN: 7.1 g/dL (ref 6.0–8.3)
Total Bilirubin: 0.9 mg/dL (ref 0.2–1.2)

## 2013-09-13 LAB — BASIC METABOLIC PANEL
BUN: 11 mg/dL (ref 6–23)
CALCIUM: 9.4 mg/dL (ref 8.4–10.5)
CO2: 30 mEq/L (ref 19–32)
CREATININE: 1.1 mg/dL (ref 0.4–1.5)
Chloride: 106 mEq/L (ref 96–112)
GFR: 91.85 mL/min (ref >60.00)
GLUCOSE: 96 mg/dL (ref 70–99)
Potassium: 4.7 mEq/L (ref 3.5–5.1)
Sodium: 141 mEq/L (ref 135–145)

## 2013-09-13 LAB — CBC WITH DIFFERENTIAL/PLATELET
BASOS ABS: 0 10*3/uL (ref 0.0–0.1)
Basophils Relative: 0.2 % (ref 0.0–3.0)
EOS PCT: 1.7 % (ref 0.0–5.0)
Eosinophils Absolute: 0.1 10*3/uL (ref 0.0–0.7)
HEMATOCRIT: 44.3 % (ref 39.0–52.0)
Hemoglobin: 14.7 g/dL (ref 13.0–17.0)
LYMPHS ABS: 1.7 10*3/uL (ref 0.7–4.0)
LYMPHS PCT: 34.8 % (ref 12.0–46.0)
MCHC: 33.3 g/dL (ref 30.0–36.0)
MCV: 96.9 fl (ref 78.0–100.0)
MONOS PCT: 6.8 % (ref 3.0–12.0)
Monocytes Absolute: 0.3 10*3/uL (ref 0.1–1.0)
NEUTROS PCT: 56.5 % (ref 43.0–77.0)
Neutro Abs: 2.8 10*3/uL (ref 1.4–7.7)
PLATELETS: 203 10*3/uL (ref 150.0–400.0)
RBC: 4.57 Mil/uL (ref 4.22–5.81)
RDW: 14.6 % (ref 11.5–15.5)
WBC: 4.9 10*3/uL (ref 4.0–10.5)

## 2013-09-13 LAB — HEMOGLOBIN A1C: HEMOGLOBIN A1C: 5.7 % (ref 4.6–6.5)

## 2013-09-13 LAB — TSH: TSH: 0.67 u[IU]/mL (ref 0.35–4.50)

## 2013-09-13 MED ORDER — BECLOMETHASONE DIPROPIONATE 40 MCG/ACT IN AERS: INHALATION_SPRAY | RESPIRATORY_TRACT | Status: DC

## 2013-09-13 MED ORDER — ALBUTEROL SULFATE HFA 108 (90 BASE) MCG/ACT IN AERS: INHALATION_SPRAY | RESPIRATORY_TRACT | Status: DC

## 2013-09-13 NOTE — Progress Notes (Signed)
Pre visit review using our clinic review tool, if applicable. No additional management support is needed unless otherwise documented below in the visit note.  Chief Complaint  Patient presents with  . Annual Exam    States his allegies and asthma have increased.  States there is a problem with his esophagus.  He has trouble swallowing food.    HPI: Patient comes in today for Preventive Health Care visit  Has anumber of issues    Migraines  headache Dr. was given Topamax but never took it because [facility] a lot of side effects and he chose not to do it he still taking Excedrin Migraine apparently about 3 times a week. Can get headaches in the middle the night or hearing near. Is not interested in daily medicine at this time.  Asthma worse under certain whether during May he had uses rescue a good bit now he is only using it a couple times a week at most. He is on the Qvar regularly.  Esophagus problems: Intermittent esophageal pressure or tightness in his mid to upper chest with dysphasia but comes and goes not associated with acute inferior temperature. Occasionally feels acid in back of throat but has no heartburn.  Fell into a storm drain whole   Grate not covered for a year   Abrasions on andterior legs  Had any injury recently and fell and a drain hole that had no cover has been that way for about a year or city he is aware it has not been fixing it was trying to reinstitute this time they have put up and fell in abrasions in the anterior legs bilaterally using peroxide topical antibiotics and covering.    Planning  umbi hernia surgery when he has things lined up. No gi sx lower  No colon yet waitng to do things in order no blood in stool.   Health Maintenance  Topic Date Due  . Colonoscopy  06/10/2007  . Influenza Vaccine  10/16/2013  . Tetanus/tdap  09/07/2019   Health Maintenance Review LIFESTYLE:  Exercise:  Not recnetly  Tobacco/ETS:no Alcohol: per day  no Sugar beverages: Sleep:ok 6-8 hours Drug use: no Colonoscopy: not yet   ROS:  Umbilical hernia Asthma headches  As above   GEN/ HEENT: No fever, significant weight changes sweats  vision problems hearing changes, CV/ PULM; No chest pain shortness of breath cough, syncope,edema  change in exercise tolerance. GI /GU: No adominal pain, vomiting, change in bowel habits. No blood in the stool. No significant GU symptoms. SKIN/HEME: ,no acute skin rashes suspicious lesions or bleeding. No lymphadenopathy, nodules, masses.  NEURO/ PSYCH:  No neurologic signs such as weakness numbness. No depression anxiety. IMM/ Allergy: No unusual infections.  Allergy .  Sometimes problematic asthma is seasonal flares REST of 12 system review negative except as per HPI   Past Medical History  Diagnosis Date  . Allergic rhinitis   . Asthma   . Chicken pox     as a child  . Migraines     Family History  Problem Relation Age of Onset  . Diabetes Mellitus II Mother   . Thyroid disease    . Hypertension    . Thyroid disease      History   Social History  . Marital Status: Single    Spouse Name: N/A    Number of Children: N/A  . Years of Education: N/A   Social History Main Topics  . Smoking status: Never Smoker   .  Smokeless tobacco: None  . Alcohol Use: No  . Drug Use: No  . Sexual Activity: None   Other Topics Concern  . 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 albuterol (PROAIR HFA) 108 (90 BASE) MCG/ACT inhaler INHALE 2 PUFFS BY MOUTH FOUR TIMES DAILY AS NEEDED FOR WHEEZING  . B Complex Vitamins (B COMPLEX 100 PO) Take 100 mg by mouth daily.  . beclomethasone (QVAR) 40 MCG/ACT inhaler INHALE 1 TO 2 PUFFS BY MOUTH TWICE DAILY AS DIRECTED  . cetirizine (ZYRTEC) 10 MG tablet Take 10  mg by mouth daily.  . Cholecalciferol (VITAMIN D3) 1000 UNITS CAPS Take 1 capsule by mouth daily.  . Flaxseed MISC by Does not apply route. Whole ground  . GINSENG CHINESE RED PO Take by mouth.  [name]. [place] Mushroom POWD 1 capsule by Does not apply route daily.  . Multiple Vitamins-Minerals (WHOLE FOOD MULTIVITAMIN) TABS Take 1 tablet by mouth 3 (three) times daily.  . Omega-3 Fatty Acids (OMEGA 3 PO) Take 1 capsule by mouth 2 (two) times daily.  . Probiotic Product (PROBIOTIC PO) Take 1 capsule by mouth daily. Green Vibrance  . triamcinolone (NASACORT ALLERGY 24HR) 55 MCG/ACT AERO nasal inhaler Place 2 sprays into the nose daily.  . vitamin E 400 UNIT capsule Take 400 Units by mouth 3 (three) times daily.  . WHEY PROTEIN PO Take 60 g by mouth.  . [DISCONTINUED] albuterol (PROAIR HFA) 108 (90 BASE) MCG/ACT inhaler INHALE 2 PUFFS BY MOUTH FOUR TIMES DAILY AS NEEDED FOR WHEEZING  . [DISCONTINUED] beclomethasone (QVAR) 40 MCG/ACT inhaler INHALE 1 TO 2 PUFFS BY MOUTH TWICE DAILY AS DIRECTED  . [DISCONTINUED] Ascorbic Acid (VITAMIN C) 1000 MG tablet Take 1,000 mg by mouth daily.  . [DISCONTINUED] fluticasone (VERAMYST) 27.5 MCG/SPRAY nasal spray Place 2 sprays into the nose 2 (two) times daily.    EXAM:  BP 128/90  Temp(Src) 98.1 F (36.7 C) (Oral)  Ht 5\' 2"  (1.575 m)  Wt 182 lb (82.555 kg)  BMI 33.28 kg/m2  Body mass index is 33.28 kg/(m^2).  Physical Exam: Vital signs reviewed ZOX:WRUEGEN:This is a well-developed well-nourished alert cooperative    who appearsr stated age in no acute distress.  HEENT: normocephalic atraumatic , Eyes: PERRL EOM's full, conjunctiva clear, Nares: paten,t no deformity discharge or tenderness., Ears: no deformity EAC's clear TMs with normal landmarks. Mouth: clear OP, no lesions, edema.  Moist mucous membranes. Dentition in adequate repair. NECK: supple without masses, thyromegaly or bruits. CHEST/PULM:  Clear to auscultation and percussion breath sounds equal no  wheeze , rales or rhonchi. No chest wall deformities or tenderness. CV: PMI is nondisplaced, S1 S2 no gallops, murmurs, rubs. Peripheral pulses are full without delay.No JVD .  ABDOMEN: Bowel sounds normal nontender  No guard or rebound, no hepato splenomegal no CVA tenderness.  Small nontender umbilical hernia hernia. Extremtities:  No clubbing cyanosis or edema, no acute joint swelling or redness no focal atrophy except for skin abrasion injuries NEURO:  Oriented x3, cranial nerves 3-12 appear to be intact, no obvious focal weakness,gait within normal limits no abnormal reflexes or asymmetrical SKIN: No acute rashes normal turgor, color, no bruising or petechiae. Anterior shins  right to 2-3 cm skin abrasions without obvious infection covered. 4 abrasions left lower extremity no edema PSYCH: Oriented, good eye contact, no obvious depression anxiety, cognition and judgment appear normal. LN: no cervical axillary inguinal adenopathy EKG shows normal sinus rhythm no acute abnormalities none to compare Lab Results  Component Value Date   WBC 4.9 09/13/2013   HGB 14.7 09/13/2013   HCT 44.3 09/13/2013   PLT 203.0 09/13/2013   GLUCOSE 96 09/13/2013   CHOL 155 09/13/2013   TRIG 31.0 09/13/2013   HDL 72.20 09/13/2013   LDLCALC 77 09/13/2013   ALT 54* 09/13/2013   AST 35 09/13/2013   NA 141 09/13/2013   K 4.7 09/13/2013   CL 106 09/13/2013   CREATININE 1.1 09/13/2013   BUN 11 09/13/2013   CO2 30 09/13/2013   TSH 0.67 09/13/2013   PSA 0.60 08/30/2009   HGBA1C 5.7 09/13/2013    ASSESSMENT AND PLAN:  Discussed the following assessment and plan:  Visit for preventive health examination - Plan: Basic metabolic panel, CBC with Differential, Hemoglobin A1c, Hepatic function panel, Lipid panel, TSH  Umbilical hernia without obstruction and without gangrene  Migraine, unspecified, without mention of intractable migraine without mention of status migrainosus - Plan: Basic metabolic panel, CBC with Differential,  Hemoglobin A1c, Hepatic function panel, Lipid panel, TSH  ASTHMA - Plan: Basic metabolic panel, CBC with Differential, Hemoglobin A1c, Hepatic function panel, Lipid panel, TSH  HYPERGLYCEMIA - Plan: Basic metabolic panel, CBC with Differential, Hemoglobin A1c, Hepatic function panel, Lipid panel, TSH  Screening for cardiovascular condition - Plan: EKG 12-Lead, Basic metabolic panel, CBC with Differential, Hemoglobin A1c, Hepatic function panel, Lipid panel, TSH  Chest discomfort - Probably esophageal and/or asthma Baseline EKG normal - Plan: EKG 12-Lead  Esophageal spasm poss reflux - Prilosec for 2 weeks and then as needed diet changes if persistent may need endoscopy  Hx of fall  Abrasion - both lower legs  superficial  cover with antibiotic ointment stop[ the peroxide.  Special screening for malignant neoplasms, colon - Plan: Fecal occult blood, imunochemical  Need for vaccination with 13-polyvalent pneumococcal conjugate vaccine - Plan: Pneumococcal conjugate vaccine 13-valent Counseled healthy lifestyle exercise healthy weight discussed wound care from his injury his tetanus shot is up to date his colonoscopy is not Prevnar advised states that he does get the flu to get the flu shot. RAV in about 3 months to reassess all of these problems or as needed. Patient Care Team: Madelin HeadingsWanda K Panosh, MD as PCP - General Patient Instructions  Get colonoscopy but in meantime can do stool test for blood . Avoid taking migraine medication. More than 2-3 x per week as can get rebound headaches and get chronic  daily headaches.  i f having to use rescue inhaler more than 3-4 days per week on a regular basis ROV to discuss management . Weight loss healthy can help your asthma and esophagus problem. Sometimes can get spasm ither time can get  Reflux. Take prilosec every day for 2 weeks and if better then as needed. If  persistent or progressive  consider seeing Gi for possible endoscopy.  dont use  peroxide on the abrasions just antibiotic and keep covered until healed .    Food Choices for Gastroesophageal Reflux Disease When you have gastroesophageal reflux disease (GERD), the foods you eat and your eating habits are very important. Choosing the right foods can help ease the discomfort of GERD. WHAT GENERAL GUIDELINES DO I NEED TO FOLLOW?  Choose  fruits, vegetables, whole grains, low-fat dairy products, and low-fat meat, fish, and poultry.  Limit fats such as oils, salad dressings, butter, nuts, and avocado.  Keep a food diary to identify foods that cause symptoms.  Avoid foods that cause reflux. These may be different for different people.  Eat frequent small meals instead of three large meals each day.  Eat your meals slowly, in a relaxed setting.  Limit fried foods.  Cook foods using methods other than frying.  Avoid drinking alcohol.  Avoid drinking large amounts of liquids with your meals.  Avoid bending over or lying down until 2-3 hours after eating. WHAT FOODS ARE NOT RECOMMENDED? The following are some foods and drinks that may worsen your symptoms: Vegetables Tomatoes. Tomato juice. Tomato and spaghetti sauce. Chili peppers. Onion and garlic. Horseradish. Fruits Oranges, grapefruit, and lemon (fruit and juice). Meats High-fat meats, fish, and poultry. This includes hot dogs, ribs, ham, sausage, salami, and bacon. Dairy Whole milk and chocolate milk. Sour cream. Cream. Butter. Ice cream. Cream cheese.  Beverages Coffee and tea, with or without caffeine. Carbonated beverages or energy drinks. Condiments Hot sauce. Barbecue sauce.  Sweets/Desserts Chocolate and cocoa. Donuts. Peppermint and spearmint. Fats and Oils High-fat foods, including [place] fries and potato chips. Other Vinegar. Strong spices, such as black pepper, white pepper, red pepper, cayenne, curry powder, cloves, ginger, and chili powder. The items listed above may not be a complete list  of foods and beverages to avoid. Contact your dietitian for more information. Document Released: 03/04/2005 Document Revised: 03/09/2013 Document Reviewed: 01/06/2013 [facility] Patient Information 2015 [place], [place]. This information is not intended to replace advice given to you by your health care provider. Make sure you discuss any questions you have with your health care provider.    Neta Mends. Panosh M.D.

## 2013-09-13 NOTE — Patient Instructions (Addendum)
Get colonoscopy but in meantime can do stool test for blood . Avoid taking migraine medication. More than 2-3 x per week as can get rebound headaches and get chronic  daily headaches.  i f having to use rescue inhaler more than 3-4 days per week on a regular basis ROV to discuss management . Weight loss healthy can help your asthma and esophagus problem. Sometimes can get spasm ither time can get  Reflux. Take prilosec every day for 2 weeks and if better then as needed. If  persistent or progressive  consider seeing Gi for possible endoscopy.  dont use peroxide on the abrasions just antibiotic and keep covered until healed .    Food Choices for Gastroesophageal Reflux Disease When you have gastroesophageal reflux disease (GERD), the foods you eat and your eating habits are very important. Choosing the right foods can help ease the discomfort of GERD. WHAT GENERAL GUIDELINES DO I NEED TO FOLLOW?  Choose fruits, vegetables, whole grains, low-fat dairy products, and low-fat meat, fish, and poultry.  Limit fats such as oils, salad dressings, butter, nuts, and avocado.  Keep a food diary to identify foods that cause symptoms.  Avoid foods that cause reflux. These may be different for different people.  Eat frequent small meals instead of three large meals each day.  Eat your meals slowly, in a relaxed setting.  Limit fried foods.  Cook foods using methods other than frying.  Avoid drinking alcohol.  Avoid drinking large amounts of liquids with your meals.  Avoid bending over or lying down until 2-3 hours after eating. WHAT FOODS ARE NOT RECOMMENDED? The following are some foods and drinks that may worsen your symptoms: Vegetables Tomatoes. Tomato juice. Tomato and spaghetti sauce. Chili peppers. Onion and garlic. Horseradish. Fruits Oranges, grapefruit, and lemon (fruit and juice). Meats High-fat meats, fish, and poultry. This includes hot dogs, ribs, ham, sausage, salami, and  bacon. Dairy Whole milk and chocolate milk. Sour cream. Cream. Butter. Ice cream. Cream cheese.  Beverages Coffee and tea, with or without caffeine. Carbonated beverages or energy drinks. Condiments Hot sauce. Barbecue sauce.  Sweets/Desserts Chocolate and cocoa. Donuts. Peppermint and spearmint. Fats and Oils High-fat foods, including [place] fries and potato chips. Other Vinegar. Strong spices, such as black pepper, white pepper, red pepper, cayenne, curry powder, cloves, ginger, and chili powder. The items listed above may not be a complete list of foods and beverages to avoid. Contact your dietitian for more information. Document Released: 03/04/2005 Document Revised: 03/09/2013 Document Reviewed: 01/06/2013 [facility] Patient Information 2015 [place], [place]. This information is not intended to replace advice given to you by your health care Candiace West. Make sure you discuss any questions you have with your health care Ashlee Bewley.

## 2013-09-27 ENCOUNTER — Telehealth: Payer: Self-pay | Admitting: Family Medicine

## 2013-09-27 NOTE — Telephone Encounter (Signed)
Ok to wait

## 2013-09-27 NOTE — Telephone Encounter (Signed)
Left a message for the pt to return my call.  

## 2013-09-27 NOTE — Telephone Encounter (Signed)
Pt has an upcoming appt on 12/16/2013.  Would like to wait and repeat LFT on that day.  Okay for visit and lab work to be done on the same day?  "Labs normal except Minor abnormality of liver test (like he has had before ) Plan repeat test lft pre visit that is due in 3 month Dx abn lfts .( doesn't have to fast )."

## 2013-09-29 NOTE — Telephone Encounter (Signed)
Patient notified he may repeat lab work at upcoming appt.

## 2013-11-23 ENCOUNTER — Other Ambulatory Visit (INDEPENDENT_AMBULATORY_CARE_PROVIDER_SITE_OTHER): Payer: BC Managed Care – PPO

## 2013-11-23 DIAGNOSIS — R195 Other fecal abnormalities: Secondary | ICD-10-CM

## 2013-11-23 LAB — POC HEMOCCULT BLD/STL (HOME/3-CARD/SCREEN)
CARD #2 DATE: NEGATIVE
Card #1 Date: NEGATIVE
Card #2 Fecal Occult Blod, POC: NEGATIVE
Card #3 Date: NEGATIVE
FECAL OCCULT BLD: NEGATIVE
Fecal Occult Blood, POC: NEGATIVE

## 2013-11-25 NOTE — Progress Notes (Signed)
Quick Note:  Inform patient stool test negative for blood . Routine follow. ______ 

## 2013-11-26 ENCOUNTER — Encounter: Payer: Self-pay | Admitting: Family Medicine

## 2013-12-16 ENCOUNTER — Encounter: Payer: Self-pay | Admitting: Internal Medicine

## 2013-12-16 ENCOUNTER — Ambulatory Visit (INDEPENDENT_AMBULATORY_CARE_PROVIDER_SITE_OTHER): Payer: BC Managed Care – PPO | Admitting: Internal Medicine

## 2013-12-16 VITALS — BP 120/78 | HR 78 | Temp 98.1°F | Wt 183.4 lb

## 2013-12-16 DIAGNOSIS — J069 Acute upper respiratory infection, unspecified: Secondary | ICD-10-CM | POA: Insufficient documentation

## 2013-12-16 DIAGNOSIS — R945 Abnormal results of liver function studies: Secondary | ICD-10-CM

## 2013-12-16 DIAGNOSIS — K224 Dyskinesia of esophagus: Secondary | ICD-10-CM

## 2013-12-16 LAB — HEPATIC FUNCTION PANEL
ALT: 53 U/L (ref 0–53)
AST: 35 U/L (ref 0–37)
Albumin: 4.1 g/dL (ref 3.5–5.2)
Alkaline Phosphatase: 63 U/L (ref 39–117)
Bilirubin, Direct: 0 mg/dL (ref 0.0–0.3)
Total Bilirubin: 0.4 mg/dL (ref 0.2–1.2)
Total Protein: 7.3 g/dL (ref 6.0–8.3)

## 2013-12-16 MED ORDER — OMEPRAZOLE 20 MG PO CPDR: 20.0000 mg | DELAYED_RELEASE_CAPSULE | Freq: Every day | ORAL | Status: DC

## 2013-12-16 MED ORDER — HYDROCODONE-HOMATROPINE 5-1.5 MG/5ML PO SYRP: 5.0000 mL | ORAL_SOLUTION | Freq: Four times a day (QID) | ORAL | Status: DC | PRN

## 2013-12-16 NOTE — Progress Notes (Signed)
Pre visit review using our clinic review tool, if applicable. No additional management support is needed unless otherwise documented below in the visit note.  Chief Complaint  Patient presents with  . Follow-up    HPI: [name]  comes in today for follow up of  multiple medical problems.   Has new illness  1 week of ur and cough sometimes quite violent no cp sob some asthma flare . No fever chills sob otherwise . Working mucinex cough drops  Prilosec helps but inc after trying to dc this .  Due for  Lab tests abn lfts .  Working long hours 12 as short staffed  ROS: See pertinent positives and negatives per HPI. No cp diarrhea rash   Past Medical History  Diagnosis Date  . Allergic rhinitis   . Asthma   . Chicken pox     as a child  . Migraines     Family History  Problem Relation Age of Onset  . Diabetes Mellitus II Mother   . Thyroid disease    . Hypertension    . Thyroid disease      History   Social History  . Marital Status: Single    Spouse Name: N/A    Number of Children: N/A  . Years of Education: N/A   Social History Main Topics  . Smoking status: Never Smoker   . Smokeless tobacco: None  . Alcohol Use: No  . Drug Use: No  . Sexual Activity: None   Other Topics Concern  . 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 albuterol (PROAIR HFA) 108 (90 BASE) MCG/ACT inhaler INHALE 2 PUFFS BY MOUTH FOUR TIMES DAILY AS NEEDED FOR WHEEZING  . B Complex Vitamins (B COMPLEX 100 PO) Take 100 mg by mouth daily.  . beclomethasone (QVAR) 40 MCG/ACT inhaler INHALE 1 TO 2 PUFFS BY MOUTH TWICE DAILY AS DIRECTED  . cetirizine (ZYRTEC) 10 MG tablet Take 10 mg by mouth daily.  . Cholecalciferol (VITAMIN D3) 1000 UNITS CAPS Take 1 capsule by mouth daily.  .  Flaxseed MISC by Does not apply route. Whole ground  . GINSENG CHINESE RED PO Take by mouth.  [name]. [place] Mushroom POWD 1 capsule by Does not apply route daily.  . Multiple Vitamins-Minerals (WHOLE FOOD MULTIVITAMIN) TABS Take 1 tablet by mouth 3 (three) times daily.  . Omega-3 Fatty Acids (OMEGA 3 PO) Take 1 capsule by mouth 2 (two) times daily.  . Probiotic Product (PROBIOTIC PO) Take 1 capsule by mouth daily. Green Vibrance  . triamcinolone (NASACORT ALLERGY 24HR) 55 MCG/ACT AERO nasal inhaler Place 2 sprays into the nose daily.  . vitamin E 400 UNIT capsule Take 400 Units by mouth 3 (three) times daily.  . WHEY PROTEIN PO Take 60 g by mouth.  Marland Kitchen. HYDROcodone-homatropine (HYCODAN) 5-1.5 MG/5ML syrup Take 5 mLs by mouth every 6 (six) hours as needed for cough. 1-2 tsp  . omeprazole (PRILOSEC) 20 MG capsule Take 1 capsule (20 mg total) by mouth daily. Or as directed    EXAM:  BP 120/78  Pulse 78  Temp(Src) 98.1 F (36.7 C) (Oral)  Wt 183 lb 6.4 oz (83.19 kg)  SpO2 98%  Body mass index is 33.54 kg/(m^2).  GENERAL: vitals reviewed and listed above, alert, oriented, appears well hydrated and in no acute distress congested  Non toxic  HEENT: atraumatic, conjunctiva  clear, no obvious abnormalities on inspection of external nose and ears mild congestion OP : no lesion edema or exudate tracks  No mucous  NECK: no obvious masses on inspection palpation  LUNGS: clear to auscultation bilaterally, no wheezes, rales or rhonchi, good air movement CV: HRRR, no clubbing cyanosis or  peripheral edema nl cap refill  Abdomen:  Sof,t normal bowel sounds protuberant  without  obv hepatosplenomegaly, no guarding rebound or masses no CVA tenderness  MS: moves all extremities without noticeable focal  abnormality  Lab Results  Component Value Date   WBC 4.9 09/13/2013   HGB 14.7 09/13/2013   HCT 44.3 09/13/2013   PLT 203.0 09/13/2013   GLUCOSE 96 09/13/2013   CHOL 155 09/13/2013   TRIG 31.0 09/13/2013   HDL  72.20 09/13/2013   LDLCALC 77 09/13/2013   ALT 54* 09/13/2013   AST 35 09/13/2013   NA 141 09/13/2013   K 4.7 09/13/2013   CL 106 09/13/2013   CREATININE 1.1 09/13/2013   BUN 11 09/13/2013   CO2 30 09/13/2013   TSH 0.67 09/13/2013   PSA 0.60 08/30/2009   HGBA1C 5.7 09/13/2013   BP Readings from Last 3 Encounters:  12/16/13 120/78  09/13/13 128/90  03/30/12 112/64   Wt Readings from Last 3 Encounters:  12/16/13 183 lb 6.4 oz (83.19 kg)  09/13/13 182 lb (82.555 kg)  03/30/12 180 lb (81.647 kg)      ASSESSMENT AND PLAN:  Discussed the following assessment and plan:  Acute upper respiratory infection of multiple sites - seems ujcomlicated  but has asthma  cough med observe for alarm features   LIVER FUNCTION TESTS, ABNORMAL - hx neg serology in past repeat ,today has acute illness - Plan: Hepatic function panel  Esophageal spasm - better on prilosec  hard to wean this  ok to rx med and then  dec aftger bette from illness   -Patient advised to return or notify health care team  if symptoms worsen ,persist or new concerns arise.  Patient Instructions  This is a viral respiratory  Infection that will tale more time to heal.   Sugar free candy      Cough med as needed. Avoid reeg use of cough drops can irritate airway insome people.   Cough may last another few weeks but should feel better  Recheck liver panel today .   Stay on prilosec through  another 2-3 weeks of this illness and then try every other day and wean . Get flu vaccine   wellness visit and labs  In June 16     Wanda K. Panosh M.D.

## 2013-12-16 NOTE — Patient Instructions (Addendum)
This is a viral respiratory  Infection that will tale more time to heal.   Sugar free candy      Cough med as needed. Avoid reeg use of cough drops can irritate airway insome people.   Cough may last another few weeks but should feel better  Recheck liver panel today .   Stay on prilosec through  another 2-3 weeks of this illness and then try every other day and wean . Get flu vaccine   wellness visit and labs  In June 16

## 2013-12-21 ENCOUNTER — Encounter: Payer: Self-pay | Admitting: Family Medicine

## 2014-01-12 ENCOUNTER — Telehealth: Payer: Self-pay | Admitting: Internal Medicine

## 2014-01-12 ENCOUNTER — Emergency Department (HOSPITAL_COMMUNITY)
Admission: EM | Admit: 2014-01-12 | Discharge: 2014-01-12 | Disposition: A | Discharge status: Home / Self Care | Payer: BC Managed Care – PPO | Attending: Emergency Medicine | Admitting: Emergency Medicine

## 2014-01-12 ENCOUNTER — Encounter (HOSPITAL_COMMUNITY): Payer: Self-pay | Admitting: Emergency Medicine

## 2014-01-12 DIAGNOSIS — J45909 Unspecified asthma, uncomplicated: Secondary | ICD-10-CM | POA: Diagnosis not present

## 2014-01-12 DIAGNOSIS — G43809 Other migraine, not intractable, without status migrainosus: Secondary | ICD-10-CM | POA: Diagnosis not present

## 2014-01-12 DIAGNOSIS — Z8619 Personal history of other infectious and parasitic diseases: Secondary | ICD-10-CM | POA: Diagnosis not present

## 2014-01-12 DIAGNOSIS — Z7951 Long term (current) use of inhaled steroids: Secondary | ICD-10-CM | POA: Diagnosis not present

## 2014-01-12 DIAGNOSIS — G43909 Migraine, unspecified, not intractable, without status migrainosus: Secondary | ICD-10-CM | POA: Diagnosis present

## 2014-01-12 DIAGNOSIS — K088 Other specified disorders of teeth and supporting structures: Secondary | ICD-10-CM | POA: Insufficient documentation

## 2014-01-12 DIAGNOSIS — Z79899 Other long term (current) drug therapy: Secondary | ICD-10-CM | POA: Diagnosis not present

## 2014-01-12 HISTORY — DX: Umbilical hernia without obstruction or gangrene: K42.9

## 2014-01-12 MED ORDER — DIPHENHYDRAMINE HCL 50 MG/ML IJ SOLN
25.0000 mg | Freq: Once | INTRAMUSCULAR | Status: AC
  Administered 2014-01-12: 25 mg via INTRAVENOUS
  Filled 2014-01-12: qty 1

## 2014-01-12 MED ORDER — KETOROLAC TROMETHAMINE 30 MG/ML IJ SOLN
30.0000 mg | Freq: Once | INTRAMUSCULAR | Status: AC
  Administered 2014-01-12: 30 mg via INTRAVENOUS
  Filled 2014-01-12: qty 1

## 2014-01-12 MED ORDER — SODIUM CHLORIDE 0.9 % IV BOLUS (SEPSIS)
1000.0000 mL | Freq: Once | INTRAVENOUS | Status: AC
  Administered 2014-01-12: 1000 mL via INTRAVENOUS

## 2014-01-12 MED ORDER — ONDANSETRON HCL 4 MG/2ML IJ SOLN
4.0000 mg | Freq: Once | INTRAMUSCULAR | Status: AC
  Administered 2014-01-12: 4 mg via INTRAVENOUS
  Filled 2014-01-12: qty 2

## 2014-01-12 NOTE — ED Notes (Signed)
Patient reports a history of chronic migraines. Patient states he was told to take Topamax but has not taken it since 2014. Patient states he sees a specialist for his headaches, but has not made contact with them since 2013. Patient states he told his PCP in 2014 that he no longer wanted to take the Topamax because of the possible side effects and requested he be changed to an alternate medication. Patient states this has not been done. Patient reports a migraine that onset Saturday. Patient was seen at an urgent care and given Fioricet, Zofran & Augmentin. Patient states he was diagnosed with a sinus infection at that time. Patient states he is now having left facial/dental pain that radiates into his jaw and into his eye. Patient reports last dose of Augmentin was at 0100, last dose of Fioricet was at 0400. Patient denies relief or symptom improvement.

## 2014-01-12 NOTE — ED Notes (Signed)
Per PA, Pt to be discharged, when fluids are finished.

## 2014-01-12 NOTE — Telephone Encounter (Signed)
[facility] and then ED around  5am for migraine. Pt states this migraine it is due to sensitivity to tooth and artery in face. Pt advised to see pcp for meds for migraine. Pt does not want to go on topomax. pls advise on when to get pt on schedule. Pt is available on Thurs or 10:30 -12:30 on Friday. Planning to work on Friday pls advise

## 2014-01-12 NOTE — Telephone Encounter (Signed)
appt made

## 2014-01-12 NOTE — ED Provider Notes (Signed)
CSN: [id number]     Arrival date & time 01/12/14  0600 History   First MD Initiated Contact with Patient 01/12/14 [phone]     Chief Complaint  Patient presents with  . Migraine  . Dental Pain    left side     (Consider location/radiation/quality/duration/timing/severity/associated sxs/prior Treatment) HPI  Patient to the ER with complaints of migraine that has been waxing and waning since Saturday. He was seen by the Urgent Care a few days ago for the same was was written Augmentin for a sinus infection and Fiorecet for pain. He says it has only mildly helped his symptoms. He has a neurologist that he has not seen since 2013 for his migraines and used to be on Topamax, which he has not taken in a year. He gets migraines every couple of months but they do not always last this long. His headache is on the entire left side of his head and face. It is throbbing. It was a 10/10 earlier this morning but it is now a 7/10 with his Fioricet taken at 4 pm. This headache is similar to his typical migraine headache. He has photophobia with it. Denies nausea, vomiting, diarrhea, weakness, confusion, fevers, neck pain, cough, sore throat, ear pain, CP, SOB.   Past Medical History  Diagnosis Date  . Allergic rhinitis   . Asthma   . Chicken pox     as a child  . Migraines   . Umbilical hernia    Past Surgical History  Procedure Laterality Date  . Nasal sinus surgery  1999  . Arthroscopic repair acl  1991   Family History  Problem Relation Age of Onset  . Diabetes Mellitus II Mother   . Thyroid disease    . Hypertension    . Thyroid disease     History  Substance Use Topics  . Smoking status: Never Smoker   . Smokeless tobacco: Not on file  . Alcohol Use: No    Review of Systems  10 Systems reviewed and are negative for acute change except as noted in the HPI.   Allergies  Review of patient's allergies indicates no known allergies.  Home Medications   Prior to Admission medications    Medication Sig Start Date End Date Taking? Authorizing Provider  albuterol (PROVENTIL HFA;VENTOLIN HFA) 108 (90 BASE) MCG/ACT inhaler Inhale 2 puffs into the lungs every 6 (six) hours as needed for wheezing or shortness of breath.   Yes Historical Provider, MD  amoxicillin-clavulanate (AUGMENTIN) 875-125 MG per tablet Take 1 tablet by mouth 2 (two) times daily. For 10 days   Yes Historical Provider, MD  aspirin-acetaminophen-caffeine (EXCEDRIN MIGRAINE) [phone] MG per tablet Take 2 tablets by mouth daily as needed for headache.   Yes Historical Provider, MD  B Complex Vitamins (B COMPLEX 100 PO) Take 100 mg by mouth daily.   Yes Historical Provider, MD  beclomethasone (QVAR) 40 MCG/ACT inhaler Inhale 1-2 puffs into the lungs daily. Or as directed   Yes Historical Provider, MD  butalbital-acetaminophen-caffeine (FIORICET, ESGIC) 50-325-40 MG per tablet Take 1 tablet by mouth every 4 (four) hours as needed for headache.   Yes Historical Provider, MD  Cholecalciferol (VITAMIN D3) 1000 UNITS CAPS Take 1 capsule by mouth daily.   Yes Historical Provider, MD  Flaxseed MISC by Does not apply route. Whole ground   Yes Historical Provider, MD  GINSENG CHINESE RED PO Take by mouth.   Yes Historical Provider, MD  HYDROcodone-homatropine (HYCODAN) 5-1.5 MG/5ML syrup Take 5  mLs by mouth every 6 (six) hours as needed for cough. 1-2 tsp 12/16/13  Yes Madelin Headings, MD  ibuprofen (ADVIL,MOTRIN) 200 MG tablet Take 800 mg by mouth every 6 (six) hours as needed for moderate pain.   Yes Historical Provider, MD  [place] Mushroom POWD 1 capsule by Does not apply route daily.   Yes Historical Provider, MD  Multiple Vitamins-Minerals (WHOLE FOOD MULTIVITAMIN) TABS Take 1 tablet by mouth 3 (three) times daily.   Yes Historical Provider, MD  Omega-3 Fatty Acids (OMEGA 3 PO) Take 1 capsule by mouth 2 (two) times daily.   Yes Historical Provider, MD  omeprazole (PRILOSEC) 20 MG capsule Take 1 capsule (20 mg total) by mouth  daily. Or as directed 12/16/13  Yes Madelin Headings, MD  ondansetron (ZOFRAN) 8 MG tablet Take 8 mg by mouth every 8 (eight) hours as needed for nausea or vomiting.   Yes Historical Provider, MD  Probiotic Product (PROBIOTIC PO) Take 1 capsule by mouth daily. Green Vibrance   Yes Historical Provider, MD  triamcinolone (NASACORT ALLERGY 24HR) 55 MCG/ACT AERO nasal inhaler Place 2 sprays into the nose daily.   Yes Historical Provider, MD  vitamin E 400 UNIT capsule Take 400 Units by mouth 3 (three) times daily.   Yes Historical Provider, MD  WHEY PROTEIN PO Take 60 g by mouth.   Yes Historical Provider, MD   BP 115/66  Pulse 75  Temp(Src) 98.4 F (36.9 C) (Oral)  Resp 16  Ht 5\' 2"  (1.575 m)  Wt 185 lb (83.915 kg)  BMI 33.83 kg/m2  SpO2 95% Physical Exam  Nursing note and vitals reviewed. Constitutional: He appears well-developed and well-nourished. No distress.  HENT:  Head: Normocephalic and atraumatic.  Eyes: Pupils are equal, round, and reactive to light.  Neck: Normal range of motion. Neck supple. No spinous process tenderness and no muscular tenderness present.  Cardiovascular: Normal rate and regular rhythm.   Pulmonary/Chest: Effort normal.  Abdominal: Soft.  Neurological: He is alert.  Cranial nerves II-VIII and X-XII evaluated and show no deficits. Pt alert and oriented x 3 Upper and lower extremity strength is symmetrical and physiologic Normal muscular tone No facial droop Coordination intact, no limb ataxia, finger-nose-finger normal Rapid alternating movements normal No pronator drift  Skin: Skin is warm and dry.    ED Course  Procedures (including critical care time) Labs Review Labs Reviewed - No data to display  Imaging Review No results found.   EKG Interpretation None      MDM   Final diagnoses:  Other migraine without status migrainosus, not intractable    Medications  sodium chloride 0.9 % bolus 1,000 mL (1,000 mLs Intravenous New Bag/Given  01/12/14 0702)  ondansetron (ZOFRAN) injection 4 mg (4 mg Intravenous Given 01/12/14 0702)  ketorolac (TORADOL) 30 MG/ML injection 30 mg (30 mg Intravenous Given 01/12/14 0702)  diphenhydrAMINE (BENADRYL) injection 25 mg (25 mg Intravenous Given 01/12/14 0702)   Patients symptoms have relieved with IV fluids and medications easily in the ED. He reports his pain as now 1/10. HE is well appearing. He does not want to take Topamax because he is uncomfortable with the long time side effects, which his insurance company told him was Dementia. Recommended he continue Zofran, Augmentin and Fioricet as needed and F/u with neurologist.  The patient denies any symptoms of neurological impairment or TIA's; no amaurosis, diplopia, dysphasia, or unilateral disturbance of motor or sensory function. No loss of balance or vertigo.  58  y.o.[name]'s evaluation in the Emergency Department is complete. It has been determined that no acute conditions requiring further emergency intervention are present at this time. The patient/guardian have been advised of the diagnosis and plan. We have discussed signs and symptoms that warrant return to the ED, such as changes or worsening in symptoms.  Vital signs are stable at discharge. Filed Vitals:   01/12/14 0823  BP: 115/66  Pulse: 75  Temp: 98.4 F (36.9 C)  Resp: 16    Patient/guardian has voiced understanding and agreed to follow-up with the PCP or specialist.    Dorthula Matasiffany G Dray Dente, PA-C 01/12/14 [phone]

## 2014-01-12 NOTE — ED Notes (Signed)
Pt escorted to discharge window. Verbalized understanding discharge instructions. In no acute distress.   

## 2014-01-12 NOTE — Discharge Instructions (Signed)
Please continue home medications   Migraine Headache A migraine headache is an intense, throbbing pain on one or both sides of your head. A migraine can last for 30 minutes to several hours. CAUSES  The exact cause of a migraine headache is not always known. However, a migraine may be caused when nerves in the brain become irritated and release chemicals that cause inflammation. This causes pain. Certain things may also trigger migraines, such as:  Alcohol.  Smoking.  Stress.  Menstruation.  Aged cheeses.  Foods or drinks that contain nitrates, glutamate, aspartame, or tyramine.  Lack of sleep.  Chocolate.  Caffeine.  Hunger.  Physical exertion.  Fatigue.  Medicines used to treat chest pain (nitroglycerine), birth control pills, estrogen, and some blood pressure medicines. SIGNS AND SYMPTOMS  Pain on one or both sides of your head.  Pulsating or throbbing pain.  Severe pain that prevents daily activities.  Pain that is aggravated by any physical activity.  Nausea, vomiting, or both.  Dizziness.  Pain with exposure to bright lights, loud noises, or activity.  General sensitivity to bright lights, loud noises, or smells. Before you get a migraine, you may get warning signs that a migraine is coming (aura). An aura may include:  Seeing flashing lights.  Seeing bright spots, halos, or zigzag lines.  Having tunnel vision or blurred vision.  Having feelings of numbness or tingling.  Having trouble talking.  Having muscle weakness. DIAGNOSIS  A migraine headache is often diagnosed based on:  Symptoms.  Physical exam.  A CT scan or MRI of your head. These imaging tests cannot diagnose migraines, but they can help rule out other causes of headaches. TREATMENT Medicines may be given for pain and nausea. Medicines can also be given to help prevent recurrent migraines.  HOME CARE INSTRUCTIONS  Only take over-the-counter or prescription medicines for  pain or discomfort as directed by your health care provider. The use of long-term narcotics is not recommended.  Lie down in a dark, quiet room when you have a migraine.  Keep a journal to find out what may trigger your migraine headaches. For example, write down:  What you eat and drink.  How much sleep you get.  Any change to your diet or medicines.  Limit alcohol consumption.  Quit smoking if you smoke.  Get 7-9 hours of sleep, or as recommended by your health care provider.  Limit stress.  Keep lights dim if bright lights bother you and make your migraines worse. SEEK IMMEDIATE MEDICAL CARE IF:   Your migraine becomes severe.  You have a fever.  You have a stiff neck.  You have vision loss.  You have muscular weakness or loss of muscle control.  You start losing your balance or have trouble walking.  You feel faint or pass out.  You have severe symptoms that are different from your first symptoms. MAKE SURE YOU:   Understand these instructions.  Will watch your condition.  Will get help right away if you are not doing well or get worse. Document Released: 03/04/2005 Document Revised: 07/19/2013 Document Reviewed: 11/09/2012 [facility] Patient Information 2015 [place], [place]. This information is not intended to replace advice given to you by your health care provider. Make sure you discuss any questions you have with your health care provider.

## 2014-01-12 NOTE — Telephone Encounter (Signed)
Can use the 845 wcc tomorrow Thursday    Need 30 minutes  But he may need to see a specialist to help with migraine care .

## 2014-01-13 ENCOUNTER — Encounter: Payer: Self-pay | Admitting: Internal Medicine

## 2014-01-13 ENCOUNTER — Ambulatory Visit (INDEPENDENT_AMBULATORY_CARE_PROVIDER_SITE_OTHER): Payer: BC Managed Care – PPO | Admitting: Internal Medicine

## 2014-01-13 VITALS — BP 134/88 | Temp 98.6°F | Wt 184.0 lb

## 2014-01-13 DIAGNOSIS — R51 Headache: Secondary | ICD-10-CM

## 2014-01-13 DIAGNOSIS — K0889 Other specified disorders of teeth and supporting structures: Secondary | ICD-10-CM

## 2014-01-13 DIAGNOSIS — K088 Other specified disorders of teeth and supporting structures: Secondary | ICD-10-CM

## 2014-01-13 DIAGNOSIS — R519 Headache, unspecified: Secondary | ICD-10-CM

## 2014-01-13 DIAGNOSIS — G43009 Migraine without aura, not intractable, without status migrainosus: Secondary | ICD-10-CM

## 2014-01-13 MED ORDER — AMITRIPTYLINE HCL 10 MG PO TABS: 10.0000 mg | ORAL_TABLET | Freq: Every day | ORAL | Status: DC

## 2014-01-13 MED ORDER — KETOROLAC TROMETHAMINE 60 MG/2ML IM SOLN
60.0000 mg | Freq: Once | INTRAMUSCULAR | Status: AC
  Administered 2014-01-13: 60 mg via INTRAMUSCULAR

## 2014-01-13 NOTE — Patient Instructions (Addendum)
Uncertain  If having infection and or  Only  the nerve pain . Causing .   The problem  We need you to get dental  Follow up also  This could also be a cluster type  Migraine.  Stay on the antibiotic for now . There are other treatment  for migraine  suppression also .  For now add amitryptiline 10- 20 mg at night  To see if can tamp down the  Headache. Will arrange a ct of sinus face area .  And a referral to headache wellness or other neuro.

## 2014-01-13 NOTE — Progress Notes (Signed)
Pre visit review using our clinic review tool, if applicable. No additional management support is needed unless otherwise documented below in the visit note.  Chief Complaint  Patient presents with  . Hospitalization Follow-up    ed    HPI: Patient come in for follow up from ED visit 10 28 for  headaches   Given zofran toradol and benadryl iv.  Pt says dx with "chronic migraines in past " but not seen since 2013 and had been doing ok  However rednetly increase and new pain that is worse . Concerned about molar and nerve damage left upper  Causing. Migraine.  Had dental work done in the recent past thinks teeth pain could be triggering sx  HA was different  Left upper molar.pain sensitive to touch  Also? If eye watered    began yesterday   Antibiotic was given urgent care a few days ago  For  "Sinus infection" with migraine throbbing  Pain around .  Left face feels swollen less today  July dentist changed  Old filling  And  No fever after.  "Had to do research to decide what triggering  Migraine. "   Dx chornic migraines  Per ha wellness in past .  topamax in past.   ROS: See pertinent positives and negatives per HPI.no cough high fever rash vision change but has photophobia   Past Medical History  Diagnosis Date  . Allergic rhinitis   . Asthma   . Chicken pox     as a child  . Migraines   . Umbilical hernia     Family History  Problem Relation Age of Onset  . Diabetes Mellitus II Mother   . Thyroid disease    . Hypertension    . Thyroid disease      History   Social History  . Marital Status: Single    Spouse Name: N/A    Number of Children: N/A  . Years of Education: N/A   Social History Main Topics  . Smoking status: Never Smoker   . Smokeless tobacco: None  . Alcohol Use: No  . Drug Use: No  . Sexual Activity: None   Other Topics Concern  . 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 albuterol (PROVENTIL HFA;VENTOLIN HFA) 108 (90 BASE) MCG/ACT inhaler Inhale 2 puffs into the lungs every 6 (six) hours as needed for wheezing or shortness of breath.  Marland Kitchen amoxicillin-clavulanate (AUGMENTIN) 875-125 MG per tablet Take 1 tablet by mouth 2 (two) times daily. For 10 days  . aspirin-acetaminophen-caffeine (EXCEDRIN MIGRAINE) 250-250-65 MG per tablet Take 2 tablets by mouth daily as needed for headache.  . B Complex Vitamins (B COMPLEX 100 PO) Take 100 mg by mouth daily.  . beclomethasone (QVAR) 40 MCG/ACT inhaler Inhale 1-2 puffs into the lungs daily. Or as directed  . butalbital-acetaminophen-caffeine (FIORICET, ESGIC) 50-325-40 MG per tablet Take 1 tablet by mouth every 4 (four) hours as needed for headache.  . Cholecalciferol (VITAMIN D3) 1000 UNITS CAPS Take 1 capsule by mouth daily.  . Flaxseed MISC by Does not apply route. Whole ground  . GINSENG CHINESE RED PO Take by mouth.  Marland Kitchen HYDROcodone-homatropine (HYCODAN) 5-1.5  MG/5ML syrup Take 5 mLs by mouth every 6 (six) hours as needed for cough. 1-2 tsp  . ibuprofen (ADVIL,MOTRIN) 200 MG tablet Take 800 mg by mouth every 6 (six) hours as needed for moderate pain.  [name]. [place] Mushroom POWD 1 capsule by Does not apply route daily.  . Multiple Vitamins-Minerals (WHOLE FOOD MULTIVITAMIN) TABS Take 1 tablet by mouth 3 (three) times daily.  . Omega-3 Fatty Acids (OMEGA 3 PO) Take 1 capsule by mouth 2 (two) times daily.  Marland Kitchen. omeprazole (PRILOSEC) 20 MG capsule Take 1 capsule (20 mg total) by mouth daily. Or as directed  . ondansetron (ZOFRAN) 8 MG tablet Take 8 mg by mouth every 8 (eight) hours as needed for nausea or vomiting.  . Probiotic Product (PROBIOTIC PO) Take 1 capsule by mouth daily. Green Vibrance  . triamcinolone (NASACORT ALLERGY 24HR) 55 MCG/ACT AERO nasal inhaler Place 2 sprays into the nose daily.  .  vitamin E 400 UNIT capsule Take 400 Units by mouth 3 (three) times daily.  . WHEY PROTEIN PO Take 60 g by mouth.  Marland Kitchen. amitriptyline (ELAVIL) 10 MG tablet Take 1-2 tablets (10-20 mg total) by mouth at bedtime. For headache suppression  . [EXPIRED] ketorolac (TORADOL) injection 60 mg     EXAM:  BP 134/88  Temp(Src) 98.6 F (37 C) (Oral)  Wt 184 lb (83.462 kg)  Body mass index is 33.65 kg/(m^2).  GENERAL: vitals reviewed and listed above, alert, oriented, appears well hydrated and in mild mod discomfort HEENT: atraumatic, conjunctiva  Clear left slightly watery no redcness, no obvious abnormalities on inspection of external nose and ears OP : no lesion edema or exudate tooth neg tap  Left face tender over lower maxillary and seems fuller or midly swollen  No temporal artery tenderenss but points to that side of head temple that also hursts  NECK: no obvious masses on inspection palpation  LUNGS: clear to auscultation bilaterally, no wheezes, rales or rhonchi,  CV: HRRR, no clubbing cyanosis MS: moves all extremities without noticeable focal  abnormality PSYCH: pleasant and cooperative, looks stressed and uncomfortable nl speech and gait   ASSESSMENT AND PLAN:  Discussed the following assessment and plan:  Left-sided face pain - Plan: AMB referral to headache clinic, CT Maxillofacial W/Cm, CANCELED: CT Maxillofacial WO CM  Nonintractable migraine, unspecified migraine type - Plan: AMB referral to headache clinic, CT Maxillofacial W/Cm, ketorolac (TORADOL) injection 60 mg, CANCELED: CT Maxillofacial WO CM  Tooth pain - Plan: AMB referral to headache clinic, CT Maxillofacial W/Cm, CANCELED: CT Maxillofacial WO CM Seen [facility] then ed and now fu here .  Hx of neuro management  Has in 2013  Different ha tender left maxilla near tooth area   Could still be migrainous  Type but ? If tooth an issues left face dose seem mildly swollen   Keep antibiotic and facial sinus ct  See his dentist and we will  refer to neuro for help dx and management requests   Begin tca low dose ( pt concern about se of topamax forever ) toradol today  Cautioned  Some meds can cause    HAs. Pt aware  -Patient advised to return or notify health care team  if symptoms worsen ,persist or new concerns arise.  Patient Instructions  Uncertain  If having infection and or  Only  the nerve pain . Causing .   The problem  We need you to get dental  Follow up also  This could also be a  cluster type  Migraine.  Stay on the antibiotic for now . There are other treatment  for migraine  suppression also .  For now add amitryptiline 10- 20 mg at night  To see if can tamp down the  Headache. Will arrange a ct of sinus face area .  And a referral to headache wellness or other neuro.       Neta MendsWanda K. Panosh M.D. Total visit 40mins > 50% spent counseling and coordinating care  Review and evaluation fto f

## 2014-01-14 ENCOUNTER — Ambulatory Visit (INDEPENDENT_AMBULATORY_CARE_PROVIDER_SITE_OTHER)
Admission: RE | Admit: 2014-01-14 | Discharge: 2014-01-14 | Disposition: A | Discharge status: Home / Self Care | Payer: BC Managed Care – PPO | Source: Ambulatory Visit | Attending: Internal Medicine | Admitting: Internal Medicine

## 2014-01-14 DIAGNOSIS — K0889 Other specified disorders of teeth and supporting structures: Secondary | ICD-10-CM

## 2014-01-14 DIAGNOSIS — R51 Headache: Secondary | ICD-10-CM

## 2014-01-14 DIAGNOSIS — K088 Other specified disorders of teeth and supporting structures: Secondary | ICD-10-CM

## 2014-01-14 DIAGNOSIS — G43009 Migraine without aura, not intractable, without status migrainosus: Secondary | ICD-10-CM

## 2014-01-14 DIAGNOSIS — R519 Headache, unspecified: Secondary | ICD-10-CM

## 2014-01-14 MED ORDER — IOHEXOL 300 MG/ML  SOLN
80.0000 mL | Freq: Once | INTRAMUSCULAR | Status: AC | PRN
  Administered 2014-01-14: 80 mL via INTRAVENOUS

## 2014-01-14 NOTE — ED Provider Notes (Signed)
Medical screening examination/treatment/procedure(s) were performed by non-physician practitioner and as supervising physician I was immediately available for consultation/collaboration.     Suzi RootsKevin E Marjoria Mancillas, MD 01/14/14 [phone]

## 2014-01-20 ENCOUNTER — Ambulatory Visit (INDEPENDENT_AMBULATORY_CARE_PROVIDER_SITE_OTHER): Payer: BC Managed Care – PPO | Admitting: Neurology

## 2014-01-20 ENCOUNTER — Encounter: Payer: Self-pay | Admitting: Neurology

## 2014-01-20 VITALS — BP 148/86 | HR 98 | Resp 18 | Ht 62.0 in | Wt 184.0 lb

## 2014-01-20 DIAGNOSIS — R519 Headache, unspecified: Secondary | ICD-10-CM

## 2014-01-20 DIAGNOSIS — R51 Headache: Secondary | ICD-10-CM

## 2014-01-20 DIAGNOSIS — K0889 Other specified disorders of teeth and supporting structures: Secondary | ICD-10-CM

## 2014-01-20 DIAGNOSIS — K088 Other specified disorders of teeth and supporting structures: Secondary | ICD-10-CM

## 2014-01-20 DIAGNOSIS — G43011 Migraine without aura, intractable, with status migrainosus: Secondary | ICD-10-CM

## 2014-01-20 NOTE — Progress Notes (Signed)
NEUROLOGY CONSULTATION NOTE  [name] MRN: [id number] DOB: 1955/08/15  Referring provider: Dr. Fabian Sharp Primary care provider: Dr. Fabian Sharp  Reason for consult:  Headache and left facial pain  HISTORY OF PRESENT ILLNESS: [name] is a 58 year old left-handed man with history of migraines and asthma who presents for headaches.  Onset:  58 years old Location:  Variable.  Most recently, left-sided Quality:  Pressure, throbbing Intensity:  2-10/10 (usually 6-7/10) Aura:  no Prodrome:  yawning Associated symptoms:  Nausea, dizziness, phonophobia, sometimes sees spots, photophobia. Duration:  2 days (however this current headache has been going on for almost a week) Frequency:  8-10 days per month Triggers/exacerbating factors:  Smell of burning rope Relieving factors:  none Activity:  Needs to lay down 4-5 days per month  Past abortive therapy:  Tylenol, ibuprofen, Benadryl, naproxen, Excedrin Migraine (helped) Past preventative therapy:  topiramate 100mg  (stopped due to concern of potential side effects)  Current abortive therapy:  Fioricet (taking daily), Zofran  Current preventative therapy:  Just started on amitriptyline 10mg   When this current migraine started last weekend, he also developed left aching facial pain.  He also noted a pain from one of his molars on the left radiating up to the nasolabial fold, up the jaw, in the ear, behind the ear and around the left eye.  He was prescribed Augmentin at Urgent Care for presumed sinus infection.  He followed up with his PCP, who noted some mild swelling over the left maxilla.  CT maxillofacial performed 01/14/14 was unremarkable.  He saw a dentist who said that he may need a root canal, but patient is reluctant due to the cost.  At the same time the facial pain started, he also notes numbness in the tips of the thumb and first two fingers on his right hand.  There is no associated neck pain or pain down the arm.  No  weakness.  PAST MEDICAL HISTORY: Past Medical History  Diagnosis Date  . Allergic rhinitis   . Asthma   . Chicken pox     as a child  . Migraines   . Umbilical hernia     PAST SURGICAL HISTORY: Past Surgical History  Procedure Laterality Date  . Nasal sinus surgery  1999  . Arthroscopic repair acl  1991    MEDICATIONS: Current Outpatient Prescriptions on File Prior to Visit  Medication Sig Dispense Refill  . albuterol (PROVENTIL HFA;VENTOLIN HFA) 108 (90 BASE) MCG/ACT inhaler Inhale 2 puffs into the lungs every 6 (six) hours as needed for wheezing or shortness of breath.    Marland Kitchen amitriptyline (ELAVIL) 10 MG tablet Take 1-2 tablets (10-20 mg total) by mouth at bedtime. For headache suppression 60 tablet 1  . amoxicillin-clavulanate (AUGMENTIN) 875-125 MG per tablet Take 1 tablet by mouth 2 (two) times daily. For 10 days    . B Complex Vitamins (B COMPLEX 100 PO) Take 100 mg by mouth daily.    . beclomethasone (QVAR) 40 MCG/ACT inhaler Inhale 1-2 puffs into the lungs daily. Or as directed    . butalbital-acetaminophen-caffeine (FIORICET, ESGIC) 50-325-40 MG per tablet Take 1 tablet by mouth every 4 (four) hours as needed for headache.    . Cholecalciferol (VITAMIN D3) 1000 UNITS CAPS Take 1 capsule by mouth daily.    . Flaxseed MISC by Does not apply route. Whole ground    . GINSENG CHINESE RED PO Take by mouth.    Marland Kitchen HYDROcodone-homatropine (HYCODAN) 5-1.5 MG/5ML syrup Take 5 mLs  by mouth every 6 (six) hours as needed for cough. 1-2 tsp 120 mL 0  . [place] Mushroom POWD 1 capsule by Does not apply route daily.    . Multiple Vitamins-Minerals (WHOLE FOOD MULTIVITAMIN) TABS Take 1 tablet by mouth 3 (three) times daily.    . Omega-3 Fatty Acids (OMEGA 3 PO) Take 1 capsule by mouth 2 (two) times daily.    Marland Kitchen. omeprazole (PRILOSEC) 20 MG capsule Take 1 capsule (20 mg total) by mouth daily. Or as directed 30 capsule 2  . ondansetron (ZOFRAN) 8 MG tablet Take 8 mg by mouth every 8 (eight)  hours as needed for nausea or vomiting.    . Probiotic Product (PROBIOTIC PO) Take 1 capsule by mouth daily. Green Vibrance    . triamcinolone (NASACORT ALLERGY 24HR) 55 MCG/ACT AERO nasal inhaler Place 2 sprays into the nose daily.    . vitamin E 400 UNIT capsule Take 400 Units by mouth 3 (three) times daily.    . WHEY PROTEIN PO Take 60 g by mouth.    Marland Kitchen. aspirin-acetaminophen-caffeine (EXCEDRIN MIGRAINE) 250-250-65 MG per tablet Take 2 tablets by mouth daily as needed for headache.    . ibuprofen (ADVIL,MOTRIN) 200 MG tablet Take 800 mg by mouth every 6 (six) hours as needed for moderate pain.     No current facility-administered medications on file prior to visit.    ALLERGIES: No Known Allergies  FAMILY HISTORY: Family History  Problem Relation Age of Onset  . Diabetes Mellitus II Mother   . Thyroid disease    . Hypertension    . Thyroid disease      SOCIAL HISTORY: History   Social History  . Marital Status: Single    Spouse Name: N/A    Number of Children: N/A  . Years of Education: N/A   Occupational History  . Not on file.   Social History Main Topics  . Smoking status: Never Smoker   . Smokeless tobacco: Not on file  . Alcohol Use: No  . Drug Use: No  . Sexual Activity: No   Other Topics Concern  . 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    No etoh and ocass caffine              REVIEW OF SYSTEMS: Constitutional: No fevers, chills, or sweats, no generalized fatigue, change in appetite Eyes: No visual changes, double vision, eye pain Ear, nose and throat: No hearing loss, ear pain, nasal congestion, sore throat Cardiovascular: No chest pain, palpitations Respiratory:  No shortness of breath at rest or with exertion, wheezes GastrointestinaI: No nausea, vomiting, diarrhea, abdominal pain, fecal incontinence Genitourinary:  No dysuria, urinary retention or  frequency Musculoskeletal:  No neck pain, back pain Integumentary: No rash, pruritus, skin lesions Neurological: as above Psychiatric: No depression, insomnia, anxiety Endocrine: No palpitations, fatigue, diaphoresis, mood swings, change in appetite, change in weight, increased thirst Hematologic/Lymphatic:  No anemia, purpura, petechiae. Allergic/Immunologic: no itchy/runny eyes, nasal congestion, recent allergic reactions, rashes  PHYSICAL EXAM: Filed Vitals:   01/20/14 1433  BP: 148/86  Pulse: 98  Resp: 18   General: No acute distress Head:  Normocephalic/atraumatic Neck: supple, no paraspinal tenderness, full range of motion Back: No paraspinal tenderness Heart: regular rate and rhythm Lungs: Clear to auscultation bilaterally. Vascular: No carotid bruits. Neurological Exam: Mental status: alert and oriented  to person, place, and time, recent and remote memory intact, fund of knowledge intact, attention and concentration intact, speech fluent and not dysarthric, language intact. Cranial nerves: CN I: not tested CN II: pupils equal, round and reactive to light, visual fields intact, fundi unremarkable, without vessel changes, exudates, hemorrhages or papilledema. CN III, IV, VI:  full range of motion, no nystagmus, no ptosis CN V: reduced left V2-V3 CN VII: upper and lower face symmetric CN VIII: decreased hearing on the left CN IX, X: gag intact, uvula midline CN XI: sternocleidomastoid and trapezius muscles intact CN XII: tongue midline Bulk & Tone: normal, no fasciculations. Motor:  5/5 througout Sensation: endorses reduced pinprick sensation in the tips of the first three digits of his right hand.  Vibration intact. Deep Tendon Reflexes: 2+ throughout, toes downgoing. Finger to nose testing:  No dysmetria Heel to shin: no dysmetria Gait: normal station and stride.  Able to turn and walk in tandem. Romberg negative.  IMPRESSION: Migraine without aura, intractable.   Likely triggered by tooth pain. Left facial pain, likely related to tooth pain  PLAN: 1.  I have no explanation for the numbness in the fingers of the right hand.  Therefore, we will get MRI of the brain with and without contrast. 2.  If MRI unremarkable, consider starting triptan 3.  In the meantime, he will try Excedrin Migraine.  4.  Instructed to stop Fioricet. 5.  Continue amitriptyline 10mg  daily.  He is to call in 4 weeks and we can increase dose if needed. 6.  Sleep hygiene 7.  He should probably get the tooth taken care of. 8.  Follow up in 2 months.  Thank you for allowing me to take part in the care of this patient.  Shon MilletAdam Monquie Fulgham, DO  CC:  Berniece AndreasWanda Panosh, MD

## 2014-01-20 NOTE — Patient Instructions (Addendum)
1.  Continue the amitriptyline 10mg  daily at bedtime.  Call in 4 weeks and we can increase dose if needed. 2.  Try Excedrin Migraine for headache.  Stop the butalbital-acetaminophen-caffeine. 3.  [facility] and [facility] 2:45[facility]  4.  After reviewing the MRI and it looks okay, will likely start you on another pain medication if Excedrin not effective 5.  Keep headache diary 6.  Follow sleep hygiene sheet. 7.  I think the tooth pain needs to get treated as it is causing facial pain and probably triggering the migraine. 8.  Follow up in 2 months.

## 2014-02-04 ENCOUNTER — Ambulatory Visit (HOSPITAL_COMMUNITY)
Admission: RE | Admit: 2014-02-04 | Discharge: 2014-02-04 | Disposition: A | Discharge status: Home / Self Care | Payer: BC Managed Care – PPO | Source: Ambulatory Visit | Attending: Neurology | Admitting: Neurology

## 2014-02-04 DIAGNOSIS — J341 Cyst and mucocele of nose and nasal sinus: Secondary | ICD-10-CM | POA: Insufficient documentation

## 2014-02-04 DIAGNOSIS — R51 Headache: Secondary | ICD-10-CM

## 2014-02-04 DIAGNOSIS — R519 Headache, unspecified: Secondary | ICD-10-CM

## 2014-02-04 DIAGNOSIS — R200 Anesthesia of skin: Secondary | ICD-10-CM | POA: Diagnosis present

## 2014-02-04 DIAGNOSIS — G43011 Migraine without aura, intractable, with status migrainosus: Secondary | ICD-10-CM

## 2014-02-04 DIAGNOSIS — K0889 Other specified disorders of teeth and supporting structures: Secondary | ICD-10-CM

## 2014-02-04 MED ORDER — GADOBENATE DIMEGLUMINE 529 MG/ML IV SOLN
18.0000 mL | Freq: Once | INTRAVENOUS | Status: AC | PRN
  Administered 2014-02-04: 18 mL via INTRAVENOUS

## 2014-02-07 ENCOUNTER — Telehealth: Payer: Self-pay | Admitting: *Deleted

## 2014-02-07 ENCOUNTER — Telehealth: Payer: Self-pay | Admitting: Neurology

## 2014-02-07 NOTE — Telephone Encounter (Signed)
Pt called/returning your call from this morning. C/B [phone]

## 2014-02-07 NOTE — Telephone Encounter (Signed)
For patient to return call to office  

## 2014-02-07 NOTE — Telephone Encounter (Signed)
-----   Message from Cira ServantAdam Robert Jaffe, DO sent at 02/07/2014  7:34 AM EST ----- MRI of brain unremarkable.  If excedrin migraine ineffective, then he can take sumatriptan 100mg  at earliest onset of headache. He can repeat once in 2 hours if headache persists or recurs, but he cannot take more than 2 pills in 24 hours.  He only gets 9 pills a month. ----- Message -----    From: Rad Results In Interface    Sent: 02/04/2014   4:57 PM      To: Cira ServantAdam Robert Jaffe, DO

## 2014-02-08 ENCOUNTER — Telehealth: Payer: Self-pay | Admitting: *Deleted

## 2014-02-08 NOTE — Telephone Encounter (Signed)
Patient is aware of normal MRI 

## 2014-03-22 ENCOUNTER — Ambulatory Visit (INDEPENDENT_AMBULATORY_CARE_PROVIDER_SITE_OTHER): Payer: BC Managed Care – PPO | Admitting: Neurology

## 2014-03-22 ENCOUNTER — Encounter: Payer: Self-pay | Admitting: Neurology

## 2014-03-22 VITALS — BP 128/90 | HR 74 | Temp 98.1°F | Resp 18 | Ht 62.0 in | Wt 183.2 lb

## 2014-03-22 DIAGNOSIS — G43009 Migraine without aura, not intractable, without status migrainosus: Secondary | ICD-10-CM

## 2014-03-22 MED ORDER — AMITRIPTYLINE HCL 25 MG PO TABS: 25.0000 mg | ORAL_TABLET | Freq: Every day | ORAL | Status: DC

## 2014-03-22 NOTE — Patient Instructions (Signed)
1.  I will increase the amitriptyline to 25mg  at bedtime to see if we can improve the headaches further 2.  Follow up in 3 months.

## 2014-03-22 NOTE — Progress Notes (Signed)
NEUROLOGY FOLLOW UP OFFICE NOTE  [name] [id number]  HISTORY OF PRESENT ILLNESS: [name] is a 59 year old left-handed man with history of migraines and asthma who presents for headaches.  UPDATE: To investigate the numbness in right hand, an MRI of the brain with and without contrast was performed on 02/04/14, which showed non-specific white matter signal changes but nothing acute.  He says headaches are much improved. Intensity:  1/10 Duration:  4 or 5 hours  Frequency:  No more than 5 days in a month.  Current abortive therapy:  Excedrin Migraine  Current preventative therapy:  amitriptyline 10mg   He says the facial pain is better.  He will be seeing the dentist again later this month to discuss root canal.  HISTORY: Onset:  59 years old Location:  Variable.  Most recently, left-sided Quality:  Pressure, throbbing Initial intensity:  2-10/10 (usually 6-7/10) Aura:  no Prodrome:  yawning Associated symptoms:  Nausea, dizziness, phonophobia, sometimes sees spots, photophobia. Initial Duration:  2 days (however this current headache has been going on for almost a week) Initial Frequency:  8-10 days per month Triggers/exacerbating factors:  Smell of burning rope Relieving factors:  none Activity:  Needs to lay down 4-5 days per month  Past abortive therapy:  Tylenol, ibuprofen, Benadryl, naproxen, Excedrin Migraine (helped), Fioricet Past preventative therapy:  topiramate 100mg  (stopped due to concern of potential side effects)  Current abortive therapy:  Fioricet (taking daily), Zofran  Current preventative therapy:  Just started on amitriptyline 10mg   In November, he developed left aching facial pain.  He also noted a pain from one of his molars on the left radiating up to the nasolabial fold, up the jaw, in the ear, behind the ear and around the left eye.  He followed up with his PCP, who noted some mild swelling over the left maxilla.  CT maxillofacial  performed 01/14/14 was unremarkable.  He saw a dentist who said that he may need a root canal.  PAST MEDICAL HISTORY: Past Medical History  Diagnosis Date  . Allergic rhinitis   . Asthma   . Chicken pox     as a child  . Migraines   . Umbilical hernia     MEDICATIONS: Current Outpatient Prescriptions on File Prior to Visit  Medication Sig Dispense Refill  . albuterol (PROVENTIL HFA;VENTOLIN HFA) 108 (90 BASE) MCG/ACT inhaler Inhale 2 puffs into the lungs every 6 (six) hours as needed for wheezing or shortness of breath.    Marland Kitchen. aspirin-acetaminophen-caffeine (EXCEDRIN MIGRAINE) 250-250-65 MG per tablet Take 2 tablets by mouth daily as needed for headache.    . B Complex Vitamins (B COMPLEX 100 PO) Take 100 mg by mouth daily.    . beclomethasone (QVAR) 40 MCG/ACT inhaler Inhale 1-2 puffs into the lungs daily. Or as directed    . butalbital-acetaminophen-caffeine (FIORICET, ESGIC) 50-325-40 MG per tablet Take 1 tablet by mouth every 4 (four) hours as needed for headache.    . Cholecalciferol (VITAMIN D3) 1000 UNITS CAPS Take 1 capsule by mouth daily.    . Flaxseed MISC by Does not apply route. Whole ground    . GINSENG CHINESE RED PO Take by mouth.    Marland Kitchen. HYDROcodone-homatropine (HYCODAN) 5-1.5 MG/5ML syrup Take 5 mLs by mouth every 6 (six) hours as needed for cough. 1-2 tsp 120 mL 0  . ibuprofen (ADVIL,MOTRIN) 200 MG tablet Take 800 mg by mouth every 6 (six) hours as needed for moderate pain.    .Marland Kitchen  [place] Mushroom POWD 1 capsule by Does not apply route daily.    . Multiple Vitamins-Minerals (WHOLE FOOD MULTIVITAMIN) TABS Take 1 tablet by mouth 3 (three) times daily.    . Omega-3 Fatty Acids (OMEGA 3 PO) Take 1 capsule by mouth 2 (two) times daily.    Marland Kitchen omeprazole (PRILOSEC) 20 MG capsule Take 1 capsule (20 mg total) by mouth daily. Or as directed 30 capsule 2  . ondansetron (ZOFRAN) 8 MG tablet Take 8 mg by mouth every 8 (eight) hours as needed for nausea or vomiting.    . Probiotic Product  (PROBIOTIC PO) Take 1 capsule by mouth daily. Green Vibrance    . triamcinolone (NASACORT ALLERGY 24HR) 55 MCG/ACT AERO nasal inhaler Place 2 sprays into the nose daily.    . vitamin E 400 UNIT capsule Take 400 Units by mouth 3 (three) times daily.    . WHEY PROTEIN PO Take 60 g by mouth.    Marland Kitchen amoxicillin-clavulanate (AUGMENTIN) 875-125 MG per tablet Take 1 tablet by mouth 2 (two) times daily. For 10 days     No current facility-administered medications on file prior to visit.    ALLERGIES: No Known Allergies  FAMILY HISTORY: Family History  Problem Relation Age of Onset  . Diabetes Mellitus II Mother   . Thyroid disease    . Hypertension    . Thyroid disease      SOCIAL HISTORY: History   Social History  . Marital Status: Single    Spouse Name: N/A    Number of Children: N/A  . Years of Education: N/A   Occupational History  . Not on file.   Social History Main Topics  . Smoking status: Never Smoker   . Smokeless tobacco: Not on file  . Alcohol Use: No  . Drug Use: No  . Sexual Activity: No   Other Topics Concern  . 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    No etoh and ocass caffine              REVIEW OF SYSTEMS: Constitutional: No fevers, chills, or sweats, no generalized fatigue, change in appetite Eyes: No visual changes, double vision, eye pain Ear, nose and throat: No hearing loss, ear pain, nasal congestion, sore throat Cardiovascular: No chest pain, palpitations Respiratory:  No shortness of breath at rest or with exertion, wheezes GastrointestinaI: No nausea, vomiting, diarrhea, abdominal pain, fecal incontinence Genitourinary:  No dysuria, urinary retention or frequency Musculoskeletal:  No neck pain, back pain Integumentary: No rash, pruritus, skin lesions Neurological: as above Psychiatric: No depression, insomnia, anxiety Endocrine: No palpitations,  fatigue, diaphoresis, mood swings, change in appetite, change in weight, increased thirst Hematologic/Lymphatic:  No anemia, purpura, petechiae. Allergic/Immunologic: no itchy/runny eyes, nasal congestion, recent allergic reactions, rashes  PHYSICAL EXAM: Filed Vitals:   03/22/14 1009  BP: 128/90  Pulse: 74  Temp: 98.1 F (36.7 C)  Resp: 18   General: No acute distress Head:  Normocephalic/atraumatic Eyes:  Fundoscopic exam unremarkable without vessel changes, exudates, hemorrhages or papilledema. Neck: supple, no paraspinal tenderness, full range of motion Heart:  Regular rate and rhythm Lungs:  Clear to auscultation bilaterally Back: No paraspinal tenderness Neurological Exam: alert and oriented to person, place, and time. Attention span and concentration intact, recent and remote memory intact, fund of knowledge intact.  Speech fluent and not dysarthric,  language intact.  CN II-XII intact. Fundoscopic exam unremarkable without vessel changes, exudates, hemorrhages or papilledema.  Bulk and tone normal, muscle strength 5/5 throughout.  Sensation to light touch intact.  Deep tendon reflexes 2+ throughout, toes downgoing.  Finger to nose testing intact.  Gait normal  IMPRESSION: Migraine without aura, improved  PLAN: 1.  I would like to achieve better control of headaches, so will increase amitriptyline to  at bedtime 2.  Follow up in 3 months.  Shon Millet, DO  CC:  Berniece Andreas, MD

## 2014-04-01 ENCOUNTER — Other Ambulatory Visit: Payer: Self-pay | Admitting: Internal Medicine

## 2014-04-04 ENCOUNTER — Other Ambulatory Visit: Payer: Self-pay

## 2014-04-04 MED ORDER — OMEPRAZOLE 20 MG PO CPDR: 20.0000 mg | DELAYED_RELEASE_CAPSULE | Freq: Every day | ORAL | Status: AC

## 2014-04-04 NOTE — Telephone Encounter (Signed)
Denied.  This prescription has already been filled

## 2014-04-04 NOTE — Telephone Encounter (Signed)
Rx request for omeprazole 20 mg capusles- Take 1 capsule by mouth once daily #30  Pharm:  [facility] [address]  Rx sent to pharmacy.

## 2014-06-21 ENCOUNTER — Encounter: Payer: Self-pay | Admitting: Neurology

## 2014-06-21 ENCOUNTER — Ambulatory Visit (INDEPENDENT_AMBULATORY_CARE_PROVIDER_SITE_OTHER): Payer: BC Managed Care – PPO | Admitting: Neurology

## 2014-06-21 VITALS — BP 120/76 | HR 72 | Temp 97.9°F | Resp 20 | Ht 62.0 in | Wt 177.4 lb

## 2014-06-21 DIAGNOSIS — G43009 Migraine without aura, not intractable, without status migrainosus: Secondary | ICD-10-CM

## 2014-06-21 NOTE — Patient Instructions (Signed)
Continue amitriptyline 25mg  Follow up in 6 months.

## 2014-06-21 NOTE — Progress Notes (Signed)
NEUROLOGY FOLLOW UP OFFICE NOTE  [place] [id number]  HISTORY OF PRESENT ILLNESS: [name] is a 59 year old left-handed man with history of migraines and asthma who presents for headaches.  UPDATE: He has continued to have improvement in his headaches.  Since January, he started taking vitamin and herbal supplements.  He takes Iaso Tea, Chaga, Gano, Omega 3, B6 and B12 and other supplements.  He says he only takes the amitriptyline 25mg  as needed.  If he has a mild migraine, he will take the amitriptyline for 3 days and then stops.  He says all migraines have been very mild and has had only 4 migraines since January.  They last one hour at most.  He reports weight loss as well.  HISTORY: Onset:  59 years old Location:  Variable.  Most recently, left-sided Quality:  Pressure, throbbing Initial intensity:  2-10/10 (usually 6-7/10); January 1/10 Aura:  no Prodrome:  yawning Associated symptoms:  Nausea, dizziness, phonophobia, sometimes sees spots, photophobia. Initial Duration:  2 days (however this current headache has been going on for almost a week); January 4 or 5 hours Initial Frequency:  8-10 days per month; January no more than 5 days in a month Triggers/exacerbating factors:  Smell of burning rope Relieving factors:  none Activity:  Needs to lay down 4-5 days per month  Past abortive therapy:  Tylenol, ibuprofen, Benadryl, naproxen, Excedrin Migraine (helped), Fioricet Past preventative therapy:  topiramate 100mg  (stopped due to concern of potential side effects)  To investigate the numbness in right hand, an MRI of the brain with and without contrast was performed on 02/04/14, which showed non-specific white matter signal changes but nothing acute.  PAST MEDICAL HISTORY: Past Medical History  Diagnosis Date  . Allergic rhinitis   . Asthma   . Chicken pox     as a child  . Migraines   . Umbilical hernia     MEDICATIONS: Current Outpatient Prescriptions  on File Prior to Visit  Medication Sig Dispense Refill  . albuterol (PROVENTIL HFA;VENTOLIN HFA) 108 (90 BASE) MCG/ACT inhaler Inhale 2 puffs into the lungs every 6 (six) hours as needed for wheezing or shortness of breath.    Marland Kitchen. amitriptyline (ELAVIL) 25 MG tablet Take 1 tablet (25 mg total) by mouth at bedtime. 30 tablet 3  . aspirin-acetaminophen-caffeine (EXCEDRIN MIGRAINE) 250-250-65 MG per tablet Take 2 tablets by mouth daily as needed for headache.    . beclomethasone (QVAR) 40 MCG/ACT inhaler Inhale 1-2 puffs into the lungs daily. Or as directed    . butalbital-acetaminophen-caffeine (FIORICET, ESGIC) 50-325-40 MG per tablet Take 1 tablet by mouth every 4 (four) hours as needed for headache.    . Cholecalciferol (VITAMIN D3) 1000 UNITS CAPS Take 1 capsule by mouth daily.    . clindamycin (CLEOCIN) 150 MG capsule   0  . Flaxseed MISC by Does not apply route. Whole ground    . ibuprofen (ADVIL,MOTRIN) 200 MG tablet Take 800 mg by mouth every 6 (six) hours as needed for moderate pain.    . Multiple Vitamins-Minerals (WHOLE FOOD MULTIVITAMIN) TABS Take 1 tablet by mouth 3 (three) times daily.    . Omega-3 Fatty Acids (OMEGA 3 PO) Take 1 capsule by mouth 2 (two) times daily.    Marland Kitchen. omeprazole (PRILOSEC) 20 MG capsule Take 1 capsule (20 mg total) by mouth daily. Or as directed 30 capsule 5  . Probiotic Product (PROBIOTIC PO) Take 1 capsule by mouth daily. Green Vibrance    .  triamcinolone (NASACORT ALLERGY 24HR) 55 MCG/ACT AERO nasal inhaler Place 2 sprays into the nose daily.    . vitamin E 400 UNIT capsule Take 400 Units by mouth 3 (three) times daily.    . WHEY PROTEIN PO Take 60 g by mouth.    Marland Kitchen amoxicillin-clavulanate (AUGMENTIN) 875-125 MG per tablet Take 1 tablet by mouth 2 (two) times daily. For 10 days    . B Complex Vitamins (B COMPLEX 100 PO) Take 100 mg by mouth daily.    Marland Kitchen GINSENG CHINESE RED PO Take by mouth.    Marland Kitchen HYDROcodone-homatropine (HYCODAN) 5-1.5 MG/5ML syrup Take 5 mLs by  mouth every 6 (six) hours as needed for cough. 1-2 tsp (Patient not taking: Reported on 06/21/2014) 120 mL 0  . [place] Mushroom POWD 1 capsule by Does not apply route daily.    . ondansetron (ZOFRAN) 8 MG tablet Take 8 mg by mouth every 8 (eight) hours as needed for nausea or vomiting.     No current facility-administered medications on file prior to visit.    ALLERGIES: No Known Allergies  FAMILY HISTORY: Family History  Problem Relation Age of Onset  . Diabetes Mellitus II Mother   . Thyroid disease    . Hypertension    . Thyroid disease      SOCIAL HISTORY: History   Social History  . Marital Status: Single    Spouse Name: N/A  . Number of Children: N/A  . Years of Education: N/A   Occupational History  . Not on file.   Social History Main Topics  . Smoking status: Never Smoker   . Smokeless tobacco: Not on file  . Alcohol Use: No  . Drug Use: No  . Sexual Activity: No   Other Topics Concern  . 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    No etoh and ocass caffine              REVIEW OF SYSTEMS: Constitutional: No fevers, chills, or sweats, no generalized fatigue, change in appetite Eyes: No visual changes, double vision, eye pain Ear, nose and throat: No hearing loss, ear pain, nasal congestion, sore throat Cardiovascular: No chest pain, palpitations Respiratory:  No shortness of breath at rest or with exertion, wheezes GastrointestinaI: No nausea, vomiting, diarrhea, abdominal pain, fecal incontinence Genitourinary:  No dysuria, urinary retention or frequency Musculoskeletal:  No neck pain, back pain Integumentary: No rash, pruritus, skin lesions Neurological: as above Psychiatric: No depression, insomnia, anxiety Endocrine: No palpitations, fatigue, diaphoresis, mood swings, change in appetite, change in weight, increased thirst Hematologic/Lymphatic:  No anemia,  purpura, petechiae. Allergic/Immunologic: no itchy/runny eyes, nasal congestion, recent allergic reactions, rashes  PHYSICAL EXAM: Filed Vitals:   06/21/14 1126  BP: 120/76  Pulse: 72  Temp: 97.9 F (36.6 C)  Resp: 20   General: No acute distress Head:  Normocephalic/atraumatic Eyes:  Fundoscopic exam unremarkable without vessel changes, exudates, hemorrhages or papilledema. Neck: supple, no paraspinal tenderness, full range of motion Heart:  Regular rate and rhythm Lungs:  Clear to auscultation bilaterally Back: No paraspinal tenderness Neurological Exam: alert and oriented to person, place, and time. Attention span and concentration intact, recent and remote memory intact, fund of knowledge intact.  Speech fluent and not dysarthric, language intact.  CN II-XII intact. Fundoscopic exam unremarkable without vessel changes, exudates, hemorrhages or papilledema.  Bulk  and tone normal, muscle strength 5/5 throughout.  Sensation to light touch intact.  Deep tendon reflexes 2+ throughout, toes downgoing.  Finger to nose testing intact.  Gait normal  IMPRESSION: Migraine without aura, improved.  I told him that amitriptyline should be taken daily.  However, he seems to be doing well on current regimen.  I am not completely familiar with all of the supplements he is taking, so I cannot comment too much on that.  He will follow up in 6 months.  15 minutes spent with patient, over 50% spent discussing management.  Shon Millet, DO  CC: Berniece Andreas, MD

## 2014-08-26 ENCOUNTER — Other Ambulatory Visit (INDEPENDENT_AMBULATORY_CARE_PROVIDER_SITE_OTHER): Payer: BC Managed Care – PPO

## 2014-08-26 DIAGNOSIS — Z0000 Encounter for general adult medical examination without abnormal findings: Secondary | ICD-10-CM

## 2014-08-26 LAB — CBC WITH DIFFERENTIAL/PLATELET
Basophils Absolute: 0 10*3/uL (ref 0.0–0.1)
Basophils Relative: 0.5 % (ref 0.0–3.0)
Eosinophils Absolute: 0.4 10*3/uL (ref 0.0–0.7)
Eosinophils Relative: 6.2 % — ABNORMAL HIGH (ref 0.0–5.0)
HCT: 45.7 % (ref 39.0–52.0)
Hemoglobin: 15.1 g/dL (ref 13.0–17.0)
LYMPHS PCT: 28.5 % (ref 12.0–46.0)
Lymphs Abs: 1.8 10*3/uL (ref 0.7–4.0)
MCHC: 33.2 g/dL (ref 30.0–36.0)
MCV: 96 fl (ref 78.0–100.0)
MONO ABS: 0.5 10*3/uL (ref 0.1–1.0)
MONOS PCT: 7.7 % (ref 3.0–12.0)
NEUTROS ABS: 3.6 10*3/uL (ref 1.4–7.7)
NEUTROS PCT: 57.1 % (ref 43.0–77.0)
PLATELETS: 257 10*3/uL (ref 150.0–400.0)
RBC: 4.76 Mil/uL (ref 4.22–5.81)
RDW: 14.1 % (ref 11.5–15.5)
WBC: 6.2 10*3/uL (ref 4.0–10.5)

## 2014-08-26 LAB — HEPATIC FUNCTION PANEL
ALT: 54 U/L — ABNORMAL HIGH (ref 0–53)
AST: 32 U/L (ref 0–37)
Albumin: 4.3 g/dL (ref 3.5–5.2)
Alkaline Phosphatase: 70 U/L (ref 39–117)
Bilirubin, Direct: 0.1 mg/dL (ref 0.0–0.3)
Total Bilirubin: 0.4 mg/dL (ref 0.2–1.2)
Total Protein: 6.5 g/dL (ref 6.0–8.3)

## 2014-08-26 LAB — BASIC METABOLIC PANEL
BUN: 25 mg/dL — ABNORMAL HIGH (ref 6–23)
CO2: 31 meq/L (ref 19–32)
Calcium: 9.5 mg/dL (ref 8.4–10.5)
Chloride: 106 mEq/L (ref 96–112)
Creatinine, Ser: 1.2 mg/dL (ref 0.40–1.50)
GFR: 80.2 mL/min (ref >60.00)
Glucose, Bld: 91 mg/dL (ref 70–99)
Potassium: 5.3 mEq/L — ABNORMAL HIGH (ref 3.5–5.1)
SODIUM: 140 meq/L (ref 135–145)

## 2014-08-26 LAB — TSH: TSH: 0.77 u[IU]/mL (ref 0.35–4.50)

## 2014-08-26 LAB — LIPID PANEL
Cholesterol: 165 mg/dL (ref 0–200)
HDL: 60.2 mg/dL (ref >39.00)
LDL Cholesterol: 93 mg/dL (ref 0–99)
NonHDL: 104.8
Total CHOL/HDL Ratio: 3
Triglycerides: 59 mg/dL (ref 0.0–149.0)
VLDL: 11.8 mg/dL (ref 0.0–40.0)

## 2014-08-26 LAB — PSA: PSA: 0.46 ng/mL (ref 0.10–4.00)

## 2014-09-02 ENCOUNTER — Ambulatory Visit (INDEPENDENT_AMBULATORY_CARE_PROVIDER_SITE_OTHER): Payer: BC Managed Care – PPO | Admitting: Internal Medicine

## 2014-09-02 ENCOUNTER — Encounter: Payer: Self-pay | Admitting: Internal Medicine

## 2014-09-02 VITALS — BP 120/70 | Temp 98.4°F | Ht 61.0 in | Wt 176.9 lb

## 2014-09-02 DIAGNOSIS — Z0000 Encounter for general adult medical examination without abnormal findings: Secondary | ICD-10-CM

## 2014-09-02 DIAGNOSIS — R945 Abnormal results of liver function studies: Secondary | ICD-10-CM

## 2014-09-02 DIAGNOSIS — Z1211 Encounter for screening for malignant neoplasm of colon: Secondary | ICD-10-CM

## 2014-09-02 DIAGNOSIS — Z79899 Other long term (current) drug therapy: Secondary | ICD-10-CM

## 2014-09-02 NOTE — Patient Instructions (Signed)
Continue lifestyle intervention healthy eating and exercise . Do stool screen  Yearly colon cancer  Until can get colonoscopy.  Do nt take extra potassium supplement .  At this time   Recheck labs in a month(NOT FASTING   And hydrated )   HIVABY lfts bmp and if ok can wee yearly .

## 2014-09-02 NOTE — Progress Notes (Signed)
Pre visit review using our clinic review tool, if applicable. No additional management support is needed unless otherwise documented below in the visit note.  Chief Complaint  Patient presents with  . Annual Exam    HPI: Patient  [name]  59 y.o. comes in today for Preventive Health Care visit  Since lst check migraine are some better . Sees dr Tomi Likens. Exercising but knee limits at time as well as schedule . Has a bad tooth needs root canal left .   Health Maintenance  Topic Date Due  . HIV Screening  06/09/1972  . COLONOSCOPY  06/10/2007  . INFLUENZA VACCINE  10/17/2014  . TETANUS/TDAP  09/07/2019   Health Maintenance Review LIFESTYLE:  Exercise:   Works out   [employer] to 5 days  3 days . Knee is limiting  Tobacco/ETS: no Alcohol: hardly any  Sugar beverages: water Sleep:   About  5 hours  School year .    Drug use: no  Colon needs  .  Screen ?  Colon  Worms .  Tiny white. Taking  Os tea.   ROS:  GEN/ HEENT: No fever, significant weight changes sweats headaches vision problems hearing changes, CV/ PULM; No chest pain shortness of breath cough, syncope,edema  change in exercise tolerance. GI /GU: No adominal pain, vomiting, change in bowel habits. No blood in the stool. No significant GU symptoms. SKIN/HEME: ,no acute skin rashes suspicious lesions or bleeding. No lymphadenopathy, nodules, masses.  NEURO/ PSYCH:  No neurologic signs such as weakness numbness. No depression anxiety. IMM/ Allergy: No unusual infections.  Allergy .   REST of 12 system review negative except as per HPI   Past Medical History  Diagnosis Date  . Allergic rhinitis   . Asthma   . Chicken pox     as a child  . Migraines   . Umbilical hernia     Past Surgical History  Procedure Laterality Date  . Nasal sinus surgery  1999  . Arthroscopic repair acl  1991    Family History  Problem Relation Age of Onset  . Diabetes Mellitus II Mother   . Thyroid disease    . Hypertension    .  Thyroid disease      History   Social History  . Marital Status: Single    Spouse Name: N/A  . Number of Children: N/A  . Years of Education: N/A   Social History Main Topics  . Smoking status: Never Smoker   . Smokeless tobacco: Not on file  . Alcohol Use: No  . Drug Use: No  . Sexual Activity: No   Other Topics Concern  . 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 albuterol (PROVENTIL HFA;VENTOLIN HFA) 108 (90 BASE) MCG/ACT inhaler Inhale 2 puffs into the lungs every 6 (six) hours as needed for wheezing or shortness of breath.    Marland Kitchen amitriptyline (ELAVIL) 25 MG tablet Take 1 tablet (25 mg total) by mouth at bedtime. 30 tablet 3  . aspirin-acetaminophen-caffeine (EXCEDRIN MIGRAINE) 170-017-49 MG per tablet Take 2 tablets by mouth daily as needed for headache.    . beclomethasone (  QVAR) 40 MCG/ACT inhaler Inhale 1-2 puffs into the lungs daily. Or as directed    . Cholecalciferol (VITAMIN D3) 1000 UNITS CAPS Take 1 capsule by mouth daily.    . Flaxseed MISC by Does not apply route. Whole ground    . Omega-3 Fatty Acids (OMEGA 3 PO) Take 1 capsule by mouth 2 (two) times daily.    Marland Kitchen omeprazole (PRILOSEC) 20 MG capsule Take 1 capsule (20 mg total) by mouth daily. Or as directed 30 capsule 5  . OVER THE COUNTER MEDICATION chaga 100 % pure    . Probiotic Product (PROBIOTIC PO) Take 1 capsule by mouth daily. Green Vibrance    . triamcinolone (NASACORT ALLERGY 24HR) 55 MCG/ACT AERO nasal inhaler Place 2 sprays into the nose daily.    . WHEY PROTEIN PO Take 60 g by mouth.    Marland Kitchen amoxicillin-clavulanate (AUGMENTIN) 875-125 MG per tablet Take 1 tablet by mouth 2 (two) times daily. For 10 days    . B Complex Vitamins (B COMPLEX 100 PO) Take 100 mg by mouth daily.    .  butalbital-acetaminophen-caffeine (FIORICET, ESGIC) 50-325-40 MG per tablet Take 1 tablet by mouth every 4 (four) hours as needed for headache.    . clindamycin (CLEOCIN) 150 MG capsule   0  . GINSENG CHINESE RED PO Take by mouth.    Marland Kitchen HYDROcodone-homatropine (HYCODAN) 5-1.5 MG/5ML syrup Take 5 mLs by mouth every 6 (six) hours as needed for cough. 1-2 tsp (Patient not taking: Reported on 06/21/2014) 120 mL 0  . ibuprofen (ADVIL,MOTRIN) 200 MG tablet Take 800 mg by mouth every 6 (six) hours as needed for moderate pain.    [place] Mushroom POWD 1 capsule by Does not apply route daily.    . Multiple Vitamins-Minerals (WHOLE FOOD MULTIVITAMIN) TABS Take 1 tablet by mouth 3 (three) times daily.    . ondansetron (ZOFRAN) 8 MG tablet Take 8 mg by mouth every 8 (eight) hours as needed for nausea or vomiting.    . vitamin E 400 UNIT capsule Take 400 Units by mouth 3 (three) times daily.     No facility-administered medications prior to visit.     EXAM:  BP 120/70 mmHg  Temp(Src) 98.4 F (36.9 C) (Oral)  Ht _0  (1.549 m)  Wt 176 lb 14.4 oz (80.241 kg)  BMI 33.44 kg/m2  Body mass index is 33.44 kg/(m^2).  Physical Exam: Vital signs reviewed VEH:MCNO is a well-developed well-nourished alert cooperative    who appearsr stated age in no acute distress.  HEENT: normocephalic atraumatic , Eyes: PERRL EOM's full, conjunctiva clear, Nares: paten,t no deformity discharge or tenderness., Ears: no deformity EAC's clear TMs with normal landmarks. Mouth: clear OP, no lesions, edema.  Moist mucous membranes. Dentition in adequate repair. NECK: supple without masses, thyromegaly or bruits. CHEST/PULM:  Clear to auscultation and percussion breath sounds equal no wheeze , rales or rhonchi. No chest wall deformities or tenderness. CV: PMI is nondisplaced, S1 S2 no gallops, murmurs, rubs. Peripheral pulses are full without delay.No JVD .  ABDOMEN: Bowel sounds normal nontender  No guard or rebound, no hepato  splenomegal no CVA tenderness.   Extremtities:  No clubbing cyanosis or edema, no acute joint swelling or redness no focal atrophy NEURO:  Oriented x3, cranial nerves 3-12 appear to be intact, no obvious focal weakness,gait within normal limits no abnormal reflexes or asymmetrical SKIN: No acute rashes normal turgor, color, no bruising or petechiae. PSYCH: Oriented, good eye contact, no obvious depression  anxiety, cognition and judgment appear normal. LN: no cervical axillary inguinal adenopathy  Lab Results  Component Value Date   WBC 6.2 08/26/2014   HGB 15.1 08/26/2014   HCT 45.7 08/26/2014   PLT 257.0 08/26/2014   GLUCOSE 91 08/26/2014   CHOL 165 08/26/2014   TRIG 59.0 08/26/2014   HDL 60.20 08/26/2014   LDLCALC 93 08/26/2014   ALT 54* 08/26/2014   AST 32 08/26/2014   NA 140 08/26/2014   K 5.3* 08/26/2014   CL 106 08/26/2014   CREATININE 1.20 08/26/2014   BUN 25* 08/26/2014   CO2 31 08/26/2014   TSH 0.77 08/26/2014   PSA 0.46 08/26/2014   HGBA1C 5.7 09/13/2013    ASSESSMENT AND PLAN:  Discussed the following assessment and plan:  Visit for preventive health examination  LIVER FUNCTION TESTS, ABNORMAL - repeat labs ned serology in past takes many supp limit nsaid for ha consdier [place] if persitent  Colon cancer screening - ifob disc options  - Plan: Fecal occult blood, imunochemical  Medication management Potassium lfts  Patient Care Team: Burnis Medin, MD as PCP - General Patient Instructions  Continue lifestyle intervention healthy eating and exercise . Do stool screen  Yearly colon cancer  Until can get colonoscopy.  Do nt take extra potassium supplement .  At this time   Recheck labs in a month(NOT FASTING   And hydrated )   HIVABY lfts bmp and if ok can wee yearly .       Standley Brooking. Panosh M.D.

## 2014-09-20 LAB — FECAL OCCULT BLOOD, IMMUNOCHEMICAL: Fecal Occult Bld: NEGATIVE

## 2014-09-20 NOTE — Progress Notes (Signed)
Quick Note:  Inform patient stool test negative for blood . Routine follow. ______ 

## 2014-09-21 ENCOUNTER — Encounter: Payer: Self-pay | Admitting: Family Medicine

## 2014-10-03 ENCOUNTER — Other Ambulatory Visit (INDEPENDENT_AMBULATORY_CARE_PROVIDER_SITE_OTHER): Payer: BC Managed Care – PPO

## 2014-10-03 DIAGNOSIS — I10 Essential (primary) hypertension: Secondary | ICD-10-CM

## 2014-10-03 DIAGNOSIS — Z202 Contact with and (suspected) exposure to infections with a predominantly sexual mode of transmission: Secondary | ICD-10-CM

## 2014-10-03 LAB — BASIC METABOLIC PANEL
BUN: 21 mg/dL (ref 6–23)
CO2: 30 mEq/L (ref 19–32)
CREATININE: 1.07 mg/dL (ref 0.40–1.50)
Calcium: 9 mg/dL (ref 8.4–10.5)
Chloride: 106 mEq/L (ref 96–112)
GFR: 91.51 mL/min (ref >60.00)
GLUCOSE: 88 mg/dL (ref 70–99)
POTASSIUM: 4.4 meq/L (ref 3.5–5.1)
Sodium: 141 mEq/L (ref 135–145)

## 2014-10-03 LAB — HEPATIC FUNCTION PANEL
ALT: 48 U/L (ref 0–53)
AST: 37 U/L (ref 0–37)
Albumin: 3.9 g/dL (ref 3.5–5.2)
Alkaline Phosphatase: 62 U/L (ref 39–117)
BILIRUBIN DIRECT: 0.1 mg/dL (ref 0.0–0.3)
BILIRUBIN TOTAL: 0.6 mg/dL (ref 0.2–1.2)
Total Protein: 6.3 g/dL (ref 6.0–8.3)

## 2014-10-04 LAB — HIV ANTIBODY (ROUTINE TESTING W REFLEX): HIV 1&2 Ab, 4th Generation: NONREACTIVE

## 2014-12-22 ENCOUNTER — Encounter: Payer: Self-pay | Admitting: Neurology

## 2014-12-22 ENCOUNTER — Ambulatory Visit (INDEPENDENT_AMBULATORY_CARE_PROVIDER_SITE_OTHER): Payer: BC Managed Care – PPO | Admitting: Neurology

## 2014-12-22 VITALS — BP 124/80 | HR 64 | Ht 61.0 in | Wt 177.7 lb

## 2014-12-22 DIAGNOSIS — G43009 Migraine without aura, not intractable, without status migrainosus: Secondary | ICD-10-CM | POA: Diagnosis not present

## 2014-12-22 MED ORDER — AMITRIPTYLINE HCL 25 MG PO TABS: 25.0000 mg | ORAL_TABLET | Freq: Every day | ORAL | Status: DC

## 2014-12-22 NOTE — Progress Notes (Signed)
NEUROLOGY FOLLOW UP OFFICE NOTE  [name] [id number]  HISTORY OF PRESENT ILLNESS: [name] is a 59 year old left-handed man with history of migraines and asthma who presents for headaches.  UPDATE: Supplements/vitamins:  Iaso Tea, Chaga, Gano, Omega 3, B6 and B12 and other supplements.   He says he only takes the amitriptyline  as needed.   He only gets mild headaches.  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  HISTORY: Onset:  59 years old Location:  Variable.  Most recently, left-sided Quality:  Pressure, throbbing Initial intensity:  2-10/10 (usually 6-7/10); January 1/10 Aura:  no Prodrome:  yawning Associated symptoms:  Nausea, dizziness, phonophobia, sometimes sees spots, photophobia. Initial Duration:  2 days (however this current headache has been going on for almost a week); January 4 or 5 hours Initial Frequency:  8-10 days per month; January no more than 5 days in a month Triggers/exacerbating factors:  Smell of burning rope Relieving factors:  none Activity:  Needs to lay down 4-5 days per month  Past abortive therapy:  Tylenol, ibuprofen, Benadryl, naproxen, Excedrin Migraine (helped), Fioricet Past preventative therapy:  topiramate  (stopped due to concern of potential side effects)  To investigate the numbness in right hand, an MRI of the brain with and without contrast was performed on 02/04/14, which showed non-specific white matter signal changes but nothing acute.Marland Kitchen  PAST MEDICAL HISTORY: Past Medical History  Diagnosis Date  . Allergic rhinitis   . Asthma   . Chicken pox     as a child  . Migraines   . Umbilical hernia     MEDICATIONS: Current Outpatient Prescriptions on File Prior to Visit  Medication Sig Dispense Refill  . albuterol (PROVENTIL HFA;VENTOLIN HFA) 108 (90 BASE) MCG/ACT inhaler Inhale 2 puffs  into the lungs every 6 (six) hours as needed for wheezing or shortness of breath.    Marland Kitchen aspirin-acetaminophen-caffeine (EXCEDRIN MIGRAINE) 250-250-65 MG per tablet Take 2 tablets by mouth daily as needed for headache.    . beclomethasone (QVAR) 40 MCG/ACT inhaler Inhale 1-2 puffs into the lungs daily. Or as directed    . cetirizine (ZYRTEC) 10 MG tablet Take 10 mg by mouth daily.    . Cholecalciferol (VITAMIN D3) 1000 UNITS CAPS Take 1 capsule by mouth daily.    . Flaxseed MISC by Does not apply route. Whole ground    . FLAXSEED, LINSEED, PO Take by mouth.    . Omega-3 Fatty Acids (OMEGA 3 PO) Take 1 capsule by mouth 2 (two) times daily.    . Omega-3 Fatty Acids (SUPER OMEGA 3) 500 MG CAPS Take 1 capsule by mouth daily.    Marland Kitchen omeprazole (PRILOSEC) 20 MG capsule Take 1 capsule (20 mg total) by mouth daily. Or as directed 30 capsule 5  . OVER THE COUNTER MEDICATION chaga 100 % pure    . OVER THE COUNTER MEDICATION NRG:  B6-B12 ONCE DAILY    . OVER THE COUNTER MEDICATION [place]    . OVER THE COUNTER MEDICATION GANO 100% PURE EXTRACT    . OVER THE COUNTER MEDICATION AURAGIN:  THREE TABS ONCE OR TWICE DAILY    . Probiotic Product (PROBIOTIC PO) Take 1 capsule by mouth daily. Green Vibrance    . triamcinolone (NASACORT ALLERGY 24HR) 55 MCG/ACT AERO nasal inhaler Place 2 sprays into the nose daily.    . TURMERIC  PO Take 1 tablet by mouth 3 (three) times daily.    . WHEY PROTEIN PO Take 60 g by mouth.     No current facility-administered medications on file prior to visit.    ALLERGIES: No Known Allergies  FAMILY HISTORY: Family History  Problem Relation Age of Onset  . Diabetes Mellitus II Mother   . Thyroid disease    . Hypertension    . Thyroid disease      SOCIAL HISTORY: Social History   Social History  . Marital Status: Single    Spouse Name: N/A  . Number of Children: N/A  . Years of Education: N/A   Occupational History  . Not on file.   Social History Main Topics  .  Smoking status: Never Smoker   . Smokeless tobacco: Never Used  . Alcohol Use: No  . Drug Use: No  . Sexual Activity: No   Other Topics Concern  . 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    No etoh and ocass caffine              REVIEW OF SYSTEMS: Constitutional: No fevers, chills, or sweats, no generalized fatigue, change in appetite Eyes: No visual changes, double vision, eye pain Ear, nose and throat: No hearing loss, ear pain, nasal congestion, sore throat Cardiovascular: No chest pain, palpitations Respiratory:  No shortness of breath at rest or with exertion, wheezes GastrointestinaI: No nausea, vomiting, diarrhea, abdominal pain, fecal incontinence Genitourinary:  No dysuria, urinary retention or frequency Musculoskeletal:  No neck pain, back pain Integumentary: No rash, pruritus, skin lesions Neurological: as above Psychiatric: No depression, insomnia, anxiety Endocrine: No palpitations, fatigue, diaphoresis, mood swings, change in appetite, change in weight, increased thirst Hematologic/Lymphatic:  No anemia, purpura, petechiae. Allergic/Immunologic: no itchy/runny eyes, nasal congestion, recent allergic reactions, rashes  PHYSICAL EXAM: Filed Vitals:   12/22/14 0938  BP: 124/80  Pulse: 64   General: No acute distress.  Patient appears well-groomed.  Head:  Normocephalic/atraumatic Eyes:  Fundoscopic exam unremarkable without vessel changes, exudates, hemorrhages or papilledema. Neck: supple, no paraspinal tenderness, full range of motion Heart:  Regular rate and rhythm Lungs:  Clear to auscultation bilaterally Back: No paraspinal tenderness Neurological Exam: alert and oriented to person, place, and time. Attention span and concentration intact, recent and remote memory intact, fund of knowledge intact.  Speech fluent and not dysarthric, language  intact.  CN II-XII intact. Fundoscopic exam unremarkable without vessel changes, exudates, hemorrhages or papilledema.  Bulk and tone normal, muscle strength 5/5 throughout.  Sensation to light touch intact.  Deep tendon reflexes 2+ throughout.  Finger to nose and heel to shin testing intact.  Gait normal.  IMPRESSION: Migraine without aura  PLAN: Start taking amitriptyline  at bedtime every night. Call in 4 weeks with update Follow up in 5 months.  15 minutes spent face to face with patient, over 50% spent discussing management.  Shon Millet, DO  CC:  Berniece Andreas, MD

## 2014-12-22 NOTE — Patient Instructions (Signed)
Start taking the amitriptyline  every night.  Call in 4 weeks with update Follow up in March

## 2015-02-15 ENCOUNTER — Telehealth: Payer: Self-pay | Admitting: Internal Medicine

## 2015-02-15 MED ORDER — BECLOMETHASONE DIPROPIONATE 40 MCG/ACT IN AERS: 1.0000 | INHALATION_SPRAY | Freq: Every day | RESPIRATORY_TRACT | Status: DC

## 2015-02-15 MED ORDER — ALBUTEROL SULFATE HFA 108 (90 BASE) MCG/ACT IN AERS: 2.0000 | INHALATION_SPRAY | Freq: Four times a day (QID) | RESPIRATORY_TRACT | Status: DC | PRN

## 2015-02-15 NOTE — Telephone Encounter (Signed)
Sent to the pharmacy by e-scribe. 

## 2015-02-15 NOTE — Telephone Encounter (Signed)
Pt request refill of the following: beclomethasone (QVAR) 40 MCG/ACT inhaler,   albuterol (PROVENTIL HFA;VENTOLIN HFA) 108 (90 BASE) MCG/ACT inhaler   [facility]: [facility]

## 2015-02-15 NOTE — Telephone Encounter (Signed)
Ok to refill albuterol   X 3 total  Refill q var x  6

## 2015-06-15 ENCOUNTER — Encounter: Payer: Self-pay | Admitting: Neurology

## 2015-06-15 ENCOUNTER — Ambulatory Visit (INDEPENDENT_AMBULATORY_CARE_PROVIDER_SITE_OTHER): Payer: BC Managed Care – PPO | Admitting: Neurology

## 2015-06-15 VITALS — BP 124/80 | HR 91 | Ht 61.0 in | Wt 186.0 lb

## 2015-06-15 DIAGNOSIS — G43009 Migraine without aura, not intractable, without status migrainosus: Secondary | ICD-10-CM | POA: Diagnosis not present

## 2015-06-15 MED ORDER — SUMATRIPTAN SUCCINATE 100 MG PO TABS: ORAL_TABLET | ORAL | Status: DC

## 2015-06-15 NOTE — Patient Instructions (Signed)
1.  Continue amitriptyline 25mg  at bedtime.  Contact me in 2 weeks with update and we can increase dose if needed 2.  For severe headache, take sumatriptan 100mg  at earliest onset of headache.  May repeat dose once in 2 hours if needed.  Do not exceed 2 tablets in 24 hours 3.  Follow up in 4-5 months

## 2015-06-15 NOTE — Progress Notes (Signed)
NEUROLOGY FOLLOW UP OFFICE NOTE  [name] [id number]  HISTORY OF PRESENT ILLNESS: [name] is a 60 year old left-handed man with history of migraines and asthma who presents for headaches.  UPDATE: Headaches were under control until January due to different factors (sinusitis due to change in weather, flu in February, infection in right upper molar, tore left biceps causing neck pain).  He has a daily throbbing headache and has about 2 severe headaches over the past couple of months.  He had initially stopped amitriptyline but restarted it 2 weeks ago. Supplements/vitamins:  Iaso Tea, Chaga, Gano, Omega 3, B6 and B12 and other supplements.    Antidepressant:  amitriptyline 25mg  Abortive:  Excedrin Muscle relaxant:  Robaxin   HISTORY: Onset:  60 years old Location:  Variable.  Most recently, left-sided Quality:  Pressure, throbbing Initial intensity:  2-10/10 (usually 6-7/10); January 1/10 Aura:  no Prodrome:  yawning Associated symptoms:  Nausea, dizziness, phonophobia, sometimes sees spots, photophobia. Initial Duration:  2 days (however this current headache has been going on for almost a week); January 4 or 5 hours Initial Frequency:  8-10 days per month; January no more than 5 days in a month Triggers/exacerbating factors:  Smell of burning rope Relieving factors:  none Activity:  Needs to lay down 4-5 days per month  Past abortive therapy:  Tylenol, ibuprofen, Benadryl, naproxen, Excedrin Migraine (helped), Fioricet Past preventative therapy:  topiramate 100mg  (stopped due to concern of potential side effects)  To investigate the numbness in right hand, an MRI of the brain with and without contrast was performed on 02/04/14, which showed non-specific white matter signal changes but nothing acute.  PAST MEDICAL HISTORY: Past Medical History  Diagnosis Date  . Allergic rhinitis   . Asthma   . Chicken pox     as a child  . Migraines   . Umbilical hernia       MEDICATIONS: Current Outpatient Prescriptions on File Prior to Visit  Medication Sig Dispense Refill  . albuterol (PROVENTIL HFA;VENTOLIN HFA) 108 (90 BASE) MCG/ACT inhaler Inhale 2 puffs into the lungs every 6 (six) hours as needed for wheezing or shortness of breath. 1 Inhaler 2  . amitriptyline (ELAVIL) 25 MG tablet Take 1 tablet (25 mg total) by mouth at bedtime. 30 tablet 6  . aspirin-acetaminophen-caffeine (EXCEDRIN MIGRAINE) 250-250-65 MG per tablet Take 2 tablets by mouth daily as needed for headache.    . beclomethasone (QVAR) 40 MCG/ACT inhaler Inhale 1-2 puffs into the lungs daily. Or as directed 1 Inhaler 5  . cetirizine (ZYRTEC) 10 MG tablet Take 10 mg by mouth daily.    . Cholecalciferol (VITAMIN D3) 1000 UNITS CAPS Take 1 capsule by mouth daily.    . Flaxseed MISC by Does not apply route. Whole ground    . FLAXSEED, LINSEED, PO Take by mouth.    . Omega-3 Fatty Acids (OMEGA 3 PO) Take 1 capsule by mouth 2 (two) times daily.    . Omega-3 Fatty Acids (SUPER OMEGA 3) 500 MG CAPS Take 1 capsule by mouth daily.    Marland Kitchen. omeprazole (PRILOSEC) 20 MG capsule Take 1 capsule (20 mg total) by mouth daily. Or as directed 30 capsule 5  . OVER THE COUNTER MEDICATION chaga 100 % pure    . OVER THE COUNTER MEDICATION NRG:  B6-B12 ONCE DAILY    . OVER THE COUNTER MEDICATION [place]    . OVER THE COUNTER MEDICATION GANO 100% PURE EXTRACT    . OVER  THE COUNTER MEDICATION AURAGIN:  THREE TABS ONCE OR TWICE DAILY    . Probiotic Product (PROBIOTIC PO) Take 1 capsule by mouth daily. Green Vibrance    . triamcinolone (NASACORT ALLERGY 24HR) 55 MCG/ACT AERO nasal inhaler Place 2 sprays into the nose daily.    . TURMERIC PO Take 1 tablet by mouth 3 (three) times daily.    . WHEY PROTEIN PO Take 60 g by mouth.     No current facility-administered medications on file prior to visit.    ALLERGIES: No Known Allergies  FAMILY HISTORY: Family History  Problem Relation Age of Onset  . Diabetes  Mellitus II Mother   . Thyroid disease    . Hypertension    . Thyroid disease      SOCIAL HISTORY: Social History   Social History  . Marital Status: Single    Spouse Name: N/A  . Number of Children: N/A  . Years of Education: N/A   Occupational History  . Not on file.   Social History Main Topics  . Smoking status: Never Smoker   . Smokeless tobacco: Never Used  . Alcohol Use: No  . Drug Use: No  . Sexual Activity: No   Other Topics Concern  . 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    No etoh and ocass caffine              REVIEW OF SYSTEMS: Constitutional: No fevers, chills, or sweats, no generalized fatigue, change in appetite Eyes: No visual changes, double vision, eye pain Ear, nose and throat: nasal congestion Cardiovascular: No chest pain, palpitations Respiratory:  No shortness of breath at rest or with exertion, wheezes GastrointestinaI: No nausea, vomiting, diarrhea, abdominal pain, fecal incontinence Genitourinary:  No dysuria, urinary retention or frequency Musculoskeletal:  Neck pain and left arm pain Integumentary: No rash, pruritus, skin lesions Neurological: as above Psychiatric: No depression, insomnia, anxiety Endocrine: No palpitations, fatigue, diaphoresis, mood swings, change in appetite, change in weight, increased thirst Hematologic/Lymphatic:  No anemia, purpura, petechiae. Allergic/Immunologic: no itchy/runny eyes, nasal congestion, recent allergic reactions, rashes  PHYSICAL EXAM: Filed Vitals:   06/15/15 0915  BP: 124/80  Pulse: 91   General: No acute distress.  Patient appears well-groomed.   Head:  Normocephalic/atraumatic Eyes:  Fundoscopic exam unremarkable without vessel changes, exudates, hemorrhages or papilledema. Neck: supple, no paraspinal tenderness, full range of motion Heart:  Regular rate and  rhythm Lungs:  Clear to auscultation bilaterally Back: No paraspinal tenderness Neurological Exam: alert and oriented to person, place, and time. Attention span and concentration intact, recent and remote memory intact, fund of knowledge intact.  Speech fluent and not dysarthric, language intact.  CN II-XII intact. Fundoscopic exam unremarkable without vessel changes, exudates, hemorrhages or papilledema.  Bulk and tone normal, muscle strength 5/5 throughout.  Sensation to light touch, temperature and vibration intact.  Deep tendon reflexes 2+ throughout.  Finger to nose and heel to shin testing intact.  Gait normal, Romberg negative.  IMPRESSION: Migraine without aura, worse due to external triggers  PLAN: 1.  Continue amitriptyline  at bedtime.  Contact me in 2 weeks with update and we can increase dose if needed 2.  For severe headache, take sumatriptan  at earliest onset of headache.  May repeat dose once in 2 hours if needed.  Do not exceed 2 tablets in 24 hours 3.  Follow up in 4-5 months  15 minutes spent face to face with patient, over 50% spent discussing management.  Shon Millet, DO  CC:  Berniece Andreas, MD

## 2015-09-18 ENCOUNTER — Ambulatory Visit (INDEPENDENT_AMBULATORY_CARE_PROVIDER_SITE_OTHER): Payer: BC Managed Care – PPO | Admitting: Neurology

## 2015-09-18 ENCOUNTER — Encounter: Payer: Self-pay | Admitting: Neurology

## 2015-09-18 VITALS — BP 110/70 | HR 86 | Ht 61.0 in | Wt 181.6 lb

## 2015-09-18 DIAGNOSIS — G43009 Migraine without aura, not intractable, without status migrainosus: Secondary | ICD-10-CM | POA: Diagnosis not present

## 2015-09-18 MED ORDER — AMITRIPTYLINE HCL 50 MG PO TABS: 50.0000 mg | ORAL_TABLET | Freq: Every day | ORAL | Status: DC

## 2015-09-18 MED ORDER — SUMATRIPTAN SUCCINATE 100 MG PO TABS: ORAL_TABLET | ORAL | Status: DC

## 2015-09-18 NOTE — Progress Notes (Signed)
NEUROLOGY FOLLOW UP OFFICE NOTE  [name] [id number]  HISTORY OF PRESENT ILLNESS: [name] is a 60 year old left-handed man with history of migraines and asthma who presents for headaches.  UPDATE: Intensity:  No more than 5-6/10 Duration:  Usually one hour with Excedrin depending on intensity.  It could be longer. Frequency:  5 days per month. Supplements/vitamins:  Iaso Tea, Chaga, Gano, Omega 3, B6 and B12 and other supplements.    Antidepressant:  amitriptyline 25mg  Abortive:  Usually Excedrin Muscle relaxant:  Robaxin   HISTORY: Onset:  60 years old Location:  Variable.  Most recently, left-sided Quality:  Pressure, throbbing Initial intensity:  2-10/10 (usually 6-7/10); January 1/10 Aura:  no Prodrome:  yawning Associated symptoms:  Nausea, dizziness, phonophobia, sometimes sees spots, photophobia. Initial Duration:  2 days (however this current headache has been going on for almost a week); January 4 or 5 hours Initial Frequency:  8-10 days per month; January no more than 5 days in a month Triggers/exacerbating factors:  Smell of burning rope Relieving factors:  none Activity:  Needs to lay down 4-5 days per month  Past abortive therapy:  Tylenol, ibuprofen, Benadryl, naproxen, Excedrin Migraine (helped), Fioricet Past preventative therapy:  topiramate 100mg  (stopped due to concern of potential side effects)  To investigate the numbness in right hand, an MRI of the brain with and without contrast was performed on 02/04/14, which showed non-specific white matter signal changes but nothing acute.  PAST MEDICAL HISTORY: Past Medical History  Diagnosis Date  . Allergic rhinitis   . Asthma   . Chicken pox     as a child  . Migraines   . Umbilical hernia     MEDICATIONS: Current Outpatient Prescriptions on File Prior to Visit  Medication Sig Dispense Refill  . albuterol (PROVENTIL HFA;VENTOLIN HFA) 108 (90 BASE) MCG/ACT inhaler Inhale 2 puffs into  the lungs every 6 (six) hours as needed for wheezing or shortness of breath. 1 Inhaler 2  . aspirin-acetaminophen-caffeine (EXCEDRIN MIGRAINE) 250-250-65 MG per tablet Take 2 tablets by mouth daily as needed for headache.    . beclomethasone (QVAR) 40 MCG/ACT inhaler Inhale 1-2 puffs into the lungs daily. Or as directed 1 Inhaler 5  . cetirizine (ZYRTEC) 10 MG tablet Take 10 mg by mouth daily.    . Cholecalciferol (VITAMIN D3) 1000 UNITS CAPS Take 1 capsule by mouth daily.    . Flaxseed MISC by Does not apply route. Whole ground    . FLAXSEED, LINSEED, PO Take by mouth.    Marland Kitchen. HYDROcodone-acetaminophen (NORCO) 7.5-325 MG tablet Take 1 tablet by mouth every 6 (six) hours as needed for moderate pain. Reported on 06/15/2015    . methocarbamol (ROBAXIN) 500 MG tablet Take 500 mg by mouth 4 (four) times daily. Reported on 06/15/2015    . Omega-3 Fatty Acids (OMEGA 3 PO) Take 1 capsule by mouth 2 (two) times daily.    Marland Kitchen. omeprazole (PRILOSEC) 20 MG capsule Take 1 capsule (20 mg total) by mouth daily. Or as directed 30 capsule 5  . OVER THE COUNTER MEDICATION chaga 100 % pure    . OVER THE COUNTER MEDICATION NRG:  B6-B12 ONCE DAILY    . OVER THE COUNTER MEDICATION [place]    . OVER THE COUNTER MEDICATION GANO 100% PURE EXTRACT    . OVER THE COUNTER MEDICATION AURAGIN:  THREE TABS ONCE OR TWICE DAILY    . Probiotic Product (PROBIOTIC PO) Take 1 capsule by mouth daily. [place] SiGreen  Vibrance    . triamcinolone (NASACORT ALLERGY 24HR) 55 MCG/ACT AERO nasal inhaler Place 2 sprays into the nose daily.    . TURMERIC PO Take 1 tablet by mouth 3 (three) times daily.    . WHEY PROTEIN PO Take 60 g by mouth.     No current facility-administered medications on file prior to visit.    ALLERGIES: No Known Allergies  FAMILY HISTORY: Family History  Problem Relation Age of Onset  . Diabetes Mellitus II Mother   . Thyroid disease    . Hypertension    . Thyroid disease      SOCIAL HISTORY: Social History   Social  History  . Marital Status: Single    Spouse Name: N/A  . Number of Children: N/A  . Years of Education: N/A   Occupational History  . Not on file.   Social History Main Topics  . Smoking status: Never Smoker   . Smokeless tobacco: Never Used  . Alcohol Use: No  . Drug Use: No  . Sexual Activity: No   Other Topics Concern  . 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    No etoh and ocass caffine              REVIEW OF SYSTEMS: Constitutional: No fevers, chills, or sweats, no generalized fatigue, change in appetite Eyes: No visual changes, double vision, eye pain Ear, nose and throat: No hearing loss, ear pain, nasal congestion, sore throat Cardiovascular: No chest pain, palpitations Respiratory:  No shortness of breath at rest or with exertion, wheezes GastrointestinaI: No nausea, vomiting, diarrhea, abdominal pain, fecal incontinence Genitourinary:  No dysuria, urinary retention or frequency Musculoskeletal:  No neck pain, back pain Integumentary: No rash, pruritus, skin lesions Neurological: as above Psychiatric: No depression, insomnia, anxiety Endocrine: No palpitations, fatigue, diaphoresis, mood swings, change in appetite, change in weight, increased thirst Hematologic/Lymphatic:  No purpura, petechiae. Allergic/Immunologic: no itchy/runny eyes, nasal congestion, recent allergic reactions, rashes  PHYSICAL EXAM: Filed Vitals:   09/18/15 0738  BP: 110/70  Pulse: 86   General: No acute distress.  Patient appears well-groomed.  normal body habitus. Head:  Normocephalic/atraumatic Eyes:  Fundi examined but not visualized Neck: supple, no paraspinal tenderness, full range of motion Heart:  Regular rate and rhythm Lungs:  Clear to auscultation bilaterally Back: No paraspinal tenderness Neurological Exam: alert and oriented to person, place, and time.  Attention span and concentration intact, recent and remote memory intact, fund of knowledge intact.  Speech fluent and not dysarthric, language intact.  CN II-XII intact. Bulk and tone normal, muscle strength 5/5 throughout.  Sensation to light touch intact.  Deep tendon reflexes 2+ throughout.  Finger to nose and heel to shin testing intact.  Gait normal, Romberg negative.  IMPRESSION: Migraine without aura  PLAN: 1.  Increase amitriptyline to 50mg  at bedtime 2.  For acute therapy, Excedrin migraine first line, sumatriptan 100mg  second line 3.  Follow up in 6 months or as needed.  15 minutes spent face to face with patient, over 50% spent discussing management.  Shon MilletAdam Alleya Demeter, DO  CC:  Berniece AndreasWanda Panosh, MD

## 2015-09-18 NOTE — Patient Instructions (Signed)
1.  To reduce frequency of headaches further, I will increase the amitriptyline to 50mg  at bedtime.  Prescription sent to pharmacy. 2.  At earliest onset of headache, take the Excedrin.  If headache not better in 1 to 2 hours, take sumatriptan 100mg .  May repeat dose in 2 hours if needed, not to exceed 2 tablets in 24 hours. 3.  Follow up in 6 months.  Call sooner with questions or concerns.

## 2015-09-24 IMAGING — CT CT MAXILLOFACIAL W/ CM
3 series · 16 of 47 positions shown, 19 images · IV contrast (omnipaque)
Comparison: None.

CLINICAL DATA: Left-sided facial/upper left molar tooth pain and
possible swelling of her maxilla. Pain present for 3 weeks. History
of chronic migraines and nasal surgery.

EXAM:
CT MAXILLOFACIAL WITH CONTRAST
TECHNIQUE: Multidetector CT imaging of the maxillofacial structures was
performed with intravenous contrast. Multiplanar CT image
reconstructions were also generated. A small metallic BB was placed
on the right temple in order to reliably differentiate right from
left.
CONTRAST:  80mL OMNIPAQUE IOHEXOL 300 MG/ML  SOLN

[Series 4: orbit 2.0 h30s · axial · 0.36mm/px · z∈[+1137,+1281]mm · 10 of 84 slices shown, 13 images]
[im 6/84  brain]
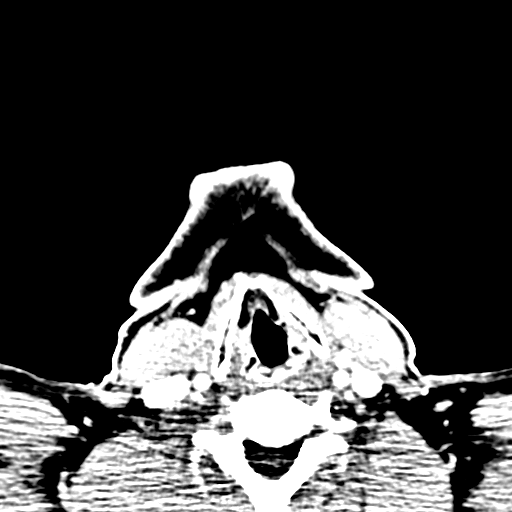
[im 6/84  bone]
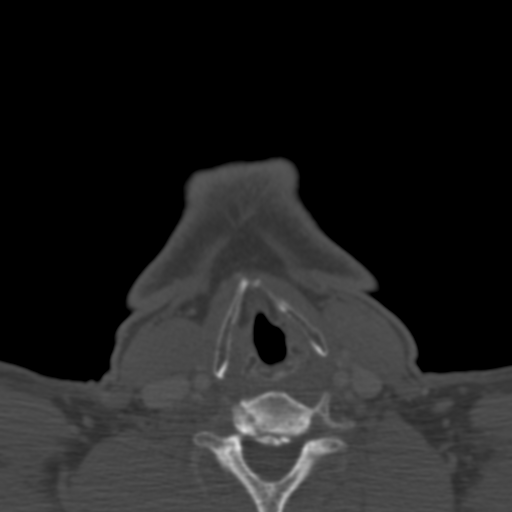
[im 15/84  bone]
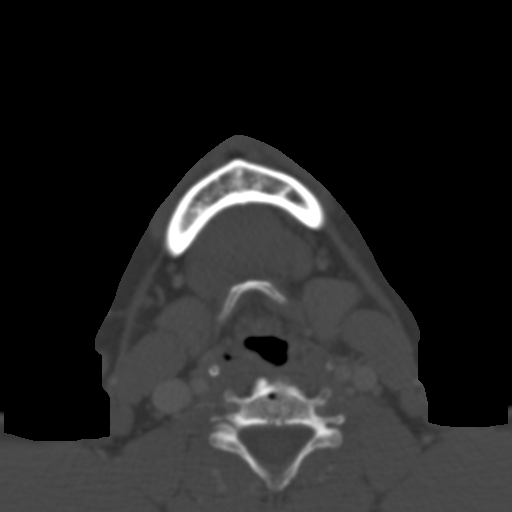
[im 23/84  bone]
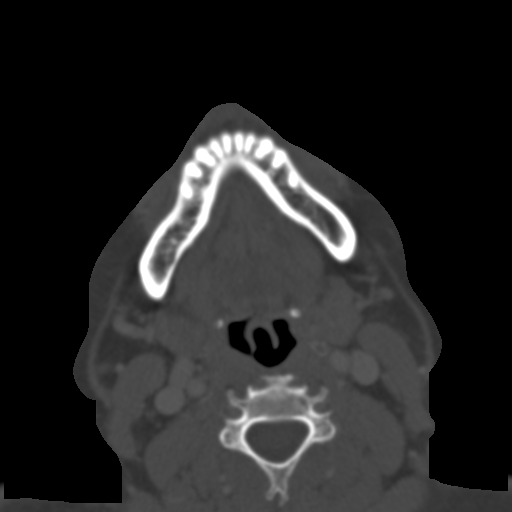
[im 29/84  bone]
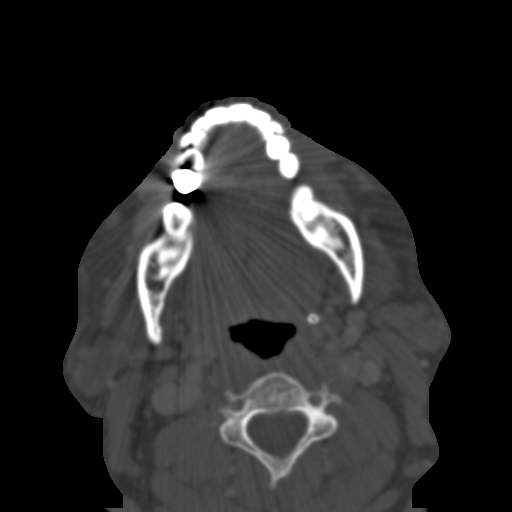
[im 38/84  brain]
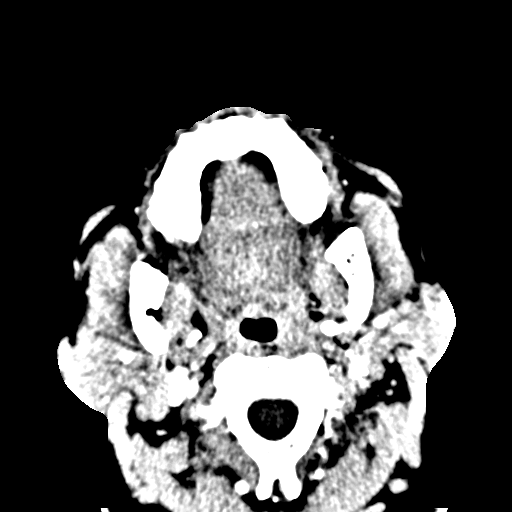
[im 38/84  bone]
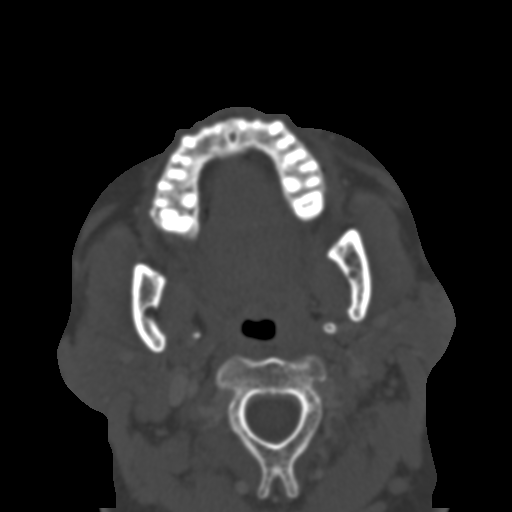
[im 46/84  bone]
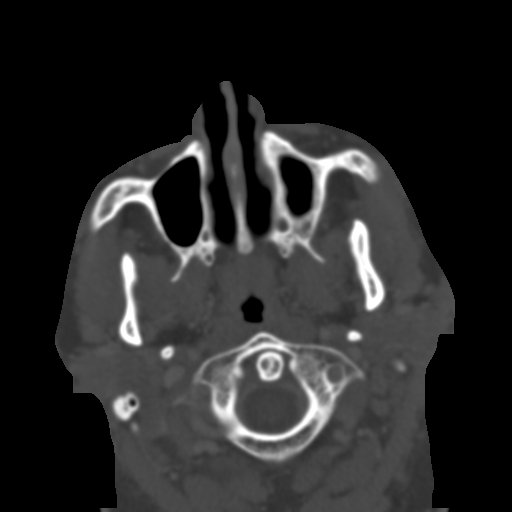
[im 55/84  bone]
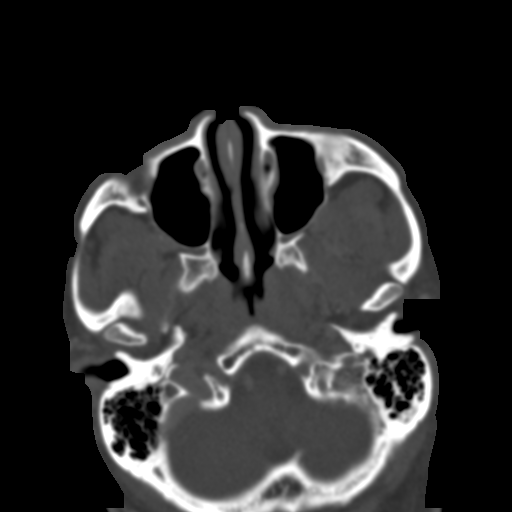
[im 63/84  bone]
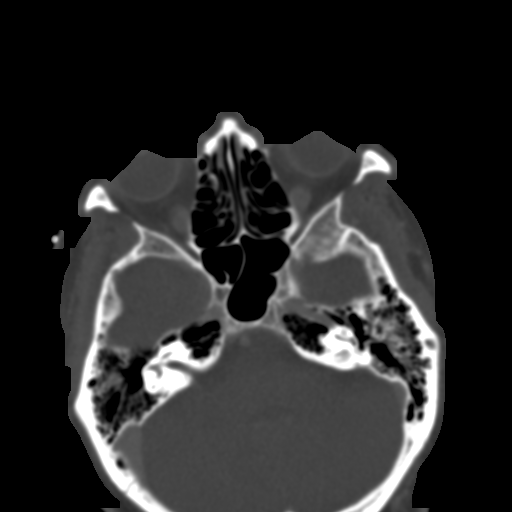
[im 69/84  brain]
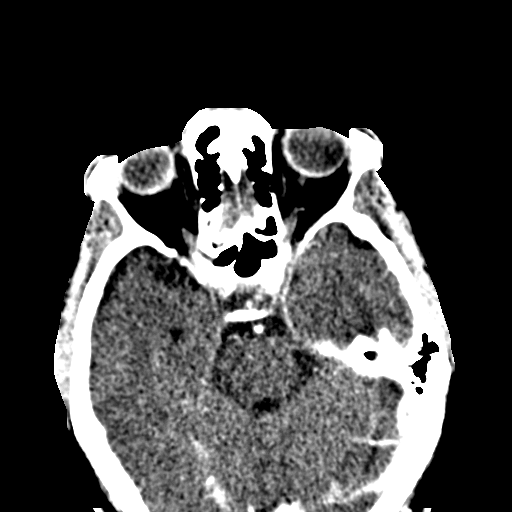
[im 69/84  bone]
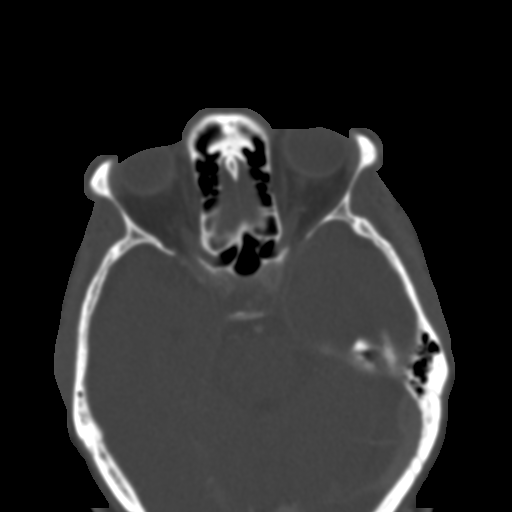
[im 78/84  bone]
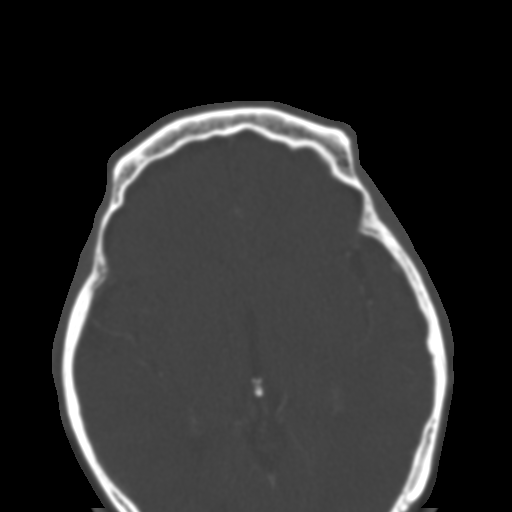

[Series 604: <mpr thick range(2)> · coronal · 0.34mm/px · 3 of 55 slices shown]
[im 19/55  bone]
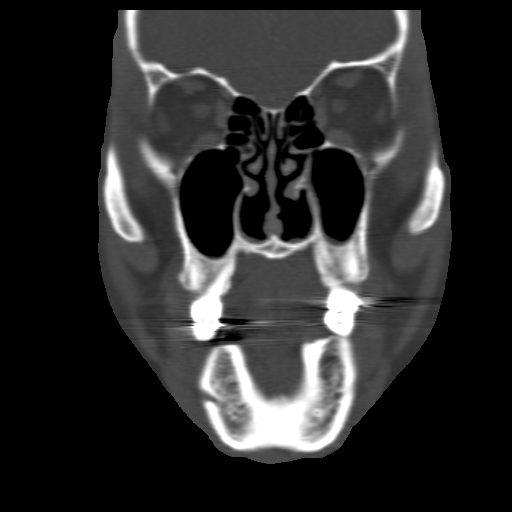
[im 25/55  bone]
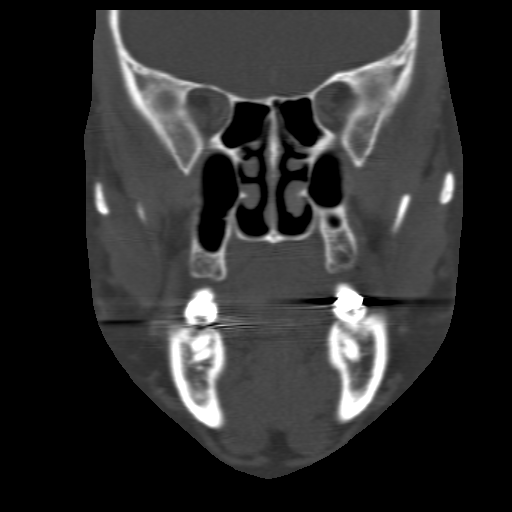
[im 31/55  bone]
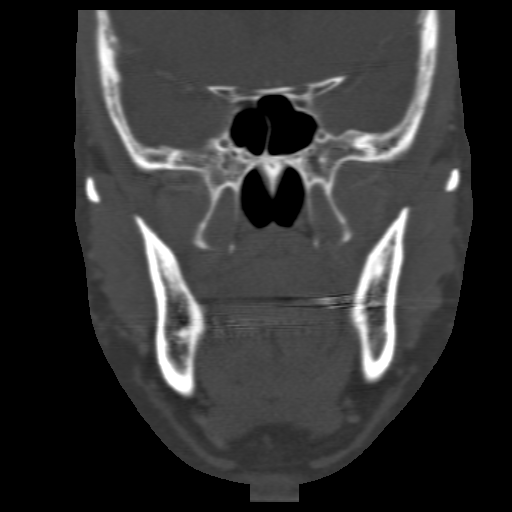

[Series 605: <mpr thick range(3)> · sagittal · 0.34mm/px · 3 of 70 slices shown]
[im 24/70  bone]
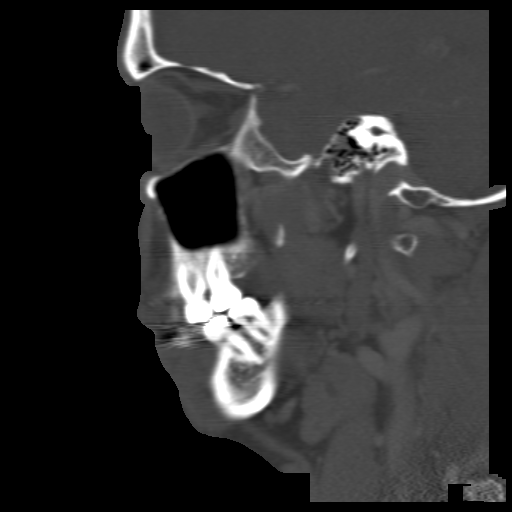
[im 35/70  bone]
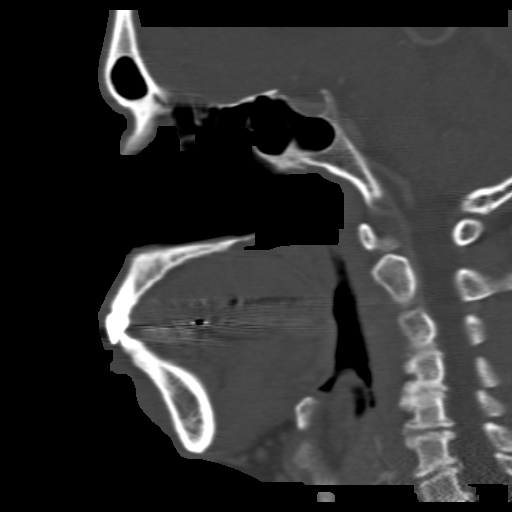
[im 47/70  bone]
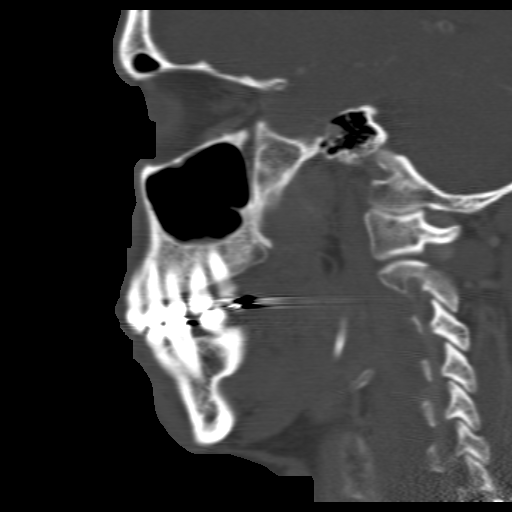

[16 of 47 positions shown; findings below may reference images not displayed]

FINDINGS: No maxillary or mandibular osseous lucencies suggestive of
periapical abscess are identified. No fluid collection is seen in
the surrounding soft tissues. No significant inflammatory changes
are identified in the soft tissues of the face. 7 mm sclerotic focus
in the left mandible may represent a small bone island.

No maxillofacial fracture is identified. Orbits are unremarkable.
Tiny osteoma is noted in the right anterior ethmoid air cell. Small
mucous retention cyst is noted in the left sphenoid sinus. Trace
left maxillary sinus mucosal thickening is present. Mastoid air
cells are clear. Visualized portion of the brain is grossly
unremarkable. There is minimal, symmetric prominence of the proximal
intraglandular submandibular ducts without salivary stones seen.
Moderate cervical spondylosis is noted.
IMPRESSION: No evidence of significant inflammatory change, abscess, or other
acute abnormality in the face.

## 2016-02-14 ENCOUNTER — Other Ambulatory Visit (INDEPENDENT_AMBULATORY_CARE_PROVIDER_SITE_OTHER): Payer: BC Managed Care – PPO

## 2016-02-14 DIAGNOSIS — Z0000 Encounter for general adult medical examination without abnormal findings: Secondary | ICD-10-CM | POA: Diagnosis not present

## 2016-02-14 LAB — BASIC METABOLIC PANEL
BUN: 19 mg/dL (ref 6–23)
CO2: 33 meq/L — AB (ref 19–32)
Calcium: 9.6 mg/dL (ref 8.4–10.5)
Chloride: 103 mEq/L (ref 96–112)
Creatinine, Ser: 1.06 mg/dL (ref 0.40–1.50)
GFR: 92.06 mL/min (ref >60.00)
GLUCOSE: 88 mg/dL (ref 70–99)
POTASSIUM: 4.8 meq/L (ref 3.5–5.1)
SODIUM: 141 meq/L (ref 135–145)

## 2016-02-14 LAB — CBC WITH DIFFERENTIAL/PLATELET
BASOS ABS: 0 10*3/uL (ref 0.0–0.1)
Basophils Relative: 0.4 % (ref 0.0–3.0)
EOS PCT: 1.5 % (ref 0.0–5.0)
Eosinophils Absolute: 0.1 10*3/uL (ref 0.0–0.7)
HEMATOCRIT: 44 % (ref 39.0–52.0)
Hemoglobin: 14.6 g/dL (ref 13.0–17.0)
LYMPHS ABS: 1.7 10*3/uL (ref 0.7–4.0)
LYMPHS PCT: 28.1 % (ref 12.0–46.0)
MCHC: 33.3 g/dL (ref 30.0–36.0)
MCV: 96.9 fl (ref 78.0–100.0)
MONOS PCT: 8.7 % (ref 3.0–12.0)
Monocytes Absolute: 0.5 10*3/uL (ref 0.1–1.0)
NEUTROS ABS: 3.7 10*3/uL (ref 1.4–7.7)
NEUTROS PCT: 61.3 % (ref 43.0–77.0)
Platelets: 211 10*3/uL (ref 150.0–400.0)
RBC: 4.53 Mil/uL (ref 4.22–5.81)
RDW: 14.3 % (ref 11.5–15.5)
WBC: 6.1 10*3/uL (ref 4.0–10.5)

## 2016-02-14 LAB — LIPID PANEL
CHOLESTEROL: 172 mg/dL (ref 0–200)
HDL: 66.6 mg/dL (ref >39.00)
LDL Cholesterol: 94 mg/dL (ref 0–99)
NONHDL: 105.08
Total CHOL/HDL Ratio: 3
Triglycerides: 54 mg/dL (ref 0.0–149.0)
VLDL: 10.8 mg/dL (ref 0.0–40.0)

## 2016-02-14 LAB — HEPATIC FUNCTION PANEL
ALBUMIN: 4.4 g/dL (ref 3.5–5.2)
ALK PHOS: 63 U/L (ref 39–117)
ALT: 41 U/L (ref 0–53)
AST: 31 U/L (ref 0–37)
Bilirubin, Direct: 0.1 mg/dL (ref 0.0–0.3)
TOTAL PROTEIN: 6.5 g/dL (ref 6.0–8.3)
Total Bilirubin: 0.6 mg/dL (ref 0.2–1.2)

## 2016-02-14 LAB — PSA: PSA: 0.58 ng/mL (ref 0.10–4.00)

## 2016-02-14 LAB — TSH: TSH: 0.83 u[IU]/mL (ref 0.35–4.50)

## 2016-02-14 NOTE — Progress Notes (Unsigned)
Pt NOT fasting for labs

## 2016-02-20 NOTE — Progress Notes (Signed)
Pre visit review using our clinic review tool, if applicable. No additional management support is needed unless otherwise documented below in the visit note.  Chief Complaint  Patient presents with  . Annual Exam    HPI: Patient  [name]  60 y.o. comes in today for Preventive Health Care visit  Since last visit he is under care of Dr. Everlena Cooper for his headaches seem to be doing okay. Needs refills of his inhalers only flares up in temperature changes time seems stable. For knees and back see is getting stem cell injections 8 out of pocket at occurs fell if it helps. He feels this was a better alternative than surgery.   Health Maintenance  Topic Date Due  . COLONOSCOPY  02/19/2017 (Originally 06/10/2007)  . TETANUS/TDAP  09/07/2019  . INFLUENZA VACCINE  Completed  . Hepatitis C Screening  Completed  . HIV Screening  Completed   Health Maintenance Review LIFESTYLE:  Exercise:   Not now   And stem cell for back . And knees .  [place].  Tobacco/ETS:n Alcohol:   no Sugar beverages: no reg  Use  Sleep: about  5.5-6 Drug use: no HH of  1 shareas a dog.  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 GI /GU: No adominal pain, vomiting, change in bowel habits. No blood in the stool. No significant GU symptoms. SKIN/HEME: ,no acute skin rashes suspicious lesions or bleeding. No lymphadenopathy, nodules, masses.  NEURO/ PSYCH:  No neurologic signs such as weakness numbness. No depression anxiety. IMM/ Allergy: No unusual infections.  Allergy .   REST of 12 system review negative except as per HPI   Past Medical History:  Diagnosis Date  . Allergic rhinitis   . Asthma   . Chicken pox    as a child  . Migraines     . Umbilical hernia     Past Surgical History:  Procedure Laterality Date  . ARTHROSCOPIC REPAIR ACL  1991  . NASAL SINUS SURGERY  1999    Family History  Problem Relation Age of Onset  . Diabetes Mellitus II Mother   . Thyroid disease    . Hypertension    . Thyroid disease      Social History   Social History  . Marital status: Single    Spouse name: N/A  . Number of children: N/A  . Years of education: N/A   Social History Main Topics  . Smoking status: Never Smoker  . Smokeless tobacco: Never Used  . Alcohol use No  . Drug use: No  . Sexual activity: No   Other Topics Concern  . 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 amitriptyline (ELAVIL) 50 MG tablet Take 1 tablet (50 mg total) by mouth at bedtime. 30 tablet 5  . aspirin-acetaminophen-caffeine (EXCEDRIN MIGRAINE) 250-250-65 MG per tablet Take 2 tablets  by mouth daily as needed for headache.    . cetirizine (ZYRTEC) 10 MG tablet Take 10 mg by mouth daily.    . Cholecalciferol (VITAMIN D3) 1000 UNITS CAPS Take 1 capsule by mouth daily.    . Flaxseed MISC by Does not apply route. Whole ground    . FLAXSEED, LINSEED, PO Take by mouth.    . Omega-3 Fatty Acids (OMEGA 3 PO) Take 1 capsule by mouth 2 (two) times daily.    Marland Kitchen. omeprazole (PRILOSEC) 20 MG capsule Take 1 capsule (20 mg total) by mouth daily. Or as directed 30 capsule 5  . OVER THE COUNTER MEDICATION chaga 100 % pure    . OVER THE COUNTER MEDICATION NRG:  B6-B12 ONCE DAILY    . OVER THE COUNTER MEDICATION [place]    . OVER THE COUNTER MEDICATION GANO 100% PURE EXTRACT    . OVER THE COUNTER MEDICATION AURAGIN:  THREE TABS ONCE OR TWICE DAILY    . Probiotic Product (PROBIOTIC PO) Take 1 capsule by mouth daily. Green Vibrance    .  SUMAtriptan (IMITREX) 100 MG tablet Take 1 tablet at earliest onset of headache.  May repeat once in 2 hours if headache persists or recurs.  Do not exceed 2 tablets in 24hrs 10 tablet 5  . triamcinolone (NASACORT ALLERGY 24HR) 55 MCG/ACT AERO nasal inhaler Place 2 sprays into the nose daily.    . TURMERIC PO Take 1 tablet by mouth 3 (three) times daily.    . WHEY PROTEIN PO Take 60 g by mouth.    Marland Kitchen. albuterol (PROVENTIL HFA;VENTOLIN HFA) 108 (90 BASE) MCG/ACT inhaler Inhale 2 puffs into the lungs every 6 (six) hours as needed for wheezing or shortness of breath. 1 Inhaler 2  . beclomethasone (QVAR) 40 MCG/ACT inhaler Inhale 1-2 puffs into the lungs daily. Or as directed 1 Inhaler 5  . HYDROcodone-acetaminophen (NORCO) 7.5-325 MG tablet Take 1 tablet by mouth every 6 (six) hours as needed for moderate pain. Reported on 06/15/2015    . methocarbamol (ROBAXIN) 500 MG tablet Take 500 mg by mouth 4 (four) times daily. Reported on 06/15/2015     No facility-administered medications prior to visit.      EXAM:  BP (!) 138/96 (BP Location: Right Arm, Cuff Size: Large)   Temp 97.9 F (36.6 C) (Oral)   Ht 5\' 1"  (1.549 m)   Wt 187 lb 9.6 oz (85.1 kg)   BMI 35.45 kg/m   Body mass index is 35.45 kg/m.  Physical Exam: Vital signs reviewed ZOX:WRUEGEN:This is a well-developed well-nourished alert cooperative    who appearsr stated age in no acute distress.  HEENT: normocephalic atraumatic , Eyes: PERRL EOM's full, conjunctiva clear, Nares: paten,t no deformity discharge or tenderness., Ears: no deformity EAC's clear TMs with normal landmarks. Mouth: clear OP, no lesions, edema.  Moist mucous membranes. Dentition in adequate repair. NECK: supple without masses, thyromegaly or bruits. CHEST/PULM:  Clear to auscultation and percussion breath sounds equal no wheeze , rales or rhonchi. No chest wall deformities or tenderness. CV: PMI is nondisplaced, S1 S2 no gallops, murmurs, rubs. Peripheral pulses are full  without delay.No JVD .  ABDOMEN: Bowel sounds normal nontender   Protuberant but no mass  Or fluid waveNo guard or rebound, no hepato splenomegal no CVA tenderness.  No hernia. Extremtities:  No clubbing cyanosis or edema, no acute joint swelling or redness no focal atrophy NEURO:  Oriented x3, cranial nerves 3-12 appear to be intact, no obvious focal  weakness,gait within normal limits no abnormal reflexes or asymmetrical SKIN: No acute rashes normal turgor, color, no bruising or petechiae. PSYCH: Oriented, good eye contact, no obvious depression anxiety, cognition and judgment appear normal. LN: no cervical axillary inguinal adenopathy  Lab Results  Component Value Date   WBC 6.1 02/14/2016   HGB 14.6 02/14/2016   HCT 44.0 02/14/2016   PLT 211.0 02/14/2016   GLUCOSE 88 02/14/2016   CHOL 172 02/14/2016   TRIG 54.0 02/14/2016   HDL 66.60 02/14/2016   LDLCALC 94 02/14/2016   ALT 41 02/14/2016   AST 31 02/14/2016   NA 141 02/14/2016   K 4.8 02/14/2016   CL 103 02/14/2016   CREATININE 1.06 02/14/2016   BUN 19 02/14/2016   CO2 33 (H) 02/14/2016   TSH 0.83 02/14/2016   PSA 0.58 02/14/2016   HGBA1C 5.7 09/13/2013   BP Readings from Last 3 Encounters:  02/21/16 (!) 138/96  09/18/15 110/70  06/15/15 124/80   Last weights.  Wt Readings from Last 3 Encounters:  02/21/16 187 lb 9.6 oz (85.1 kg)  09/18/15 181 lb 9 oz (82.4 kg)  06/15/15 186 lb (84.4 kg)    ASSESSMENT AND PLAN:  Discussed the following assessment and plan:  Visit for preventive health examination  Need for prophylactic vaccination and inoculation against influenza - Plan: Flu Vaccine QUAD 36+ mos PF IM (Fluarix & Fluzone Quad PF)  Colon cancer screening - Plan: Fecal occult blood, imunochemical  Elevated BP without diagnosis of hypertension - confirm  as discussed and fu  Medication management - refill in halers  stable   BMI 35.0-35.9,adult Colon cancer screening look into cologuard this year or teacher  screening. To follow-up with high blood pressure readings get a new monitor come back in in 2-3 months unless better sending results in to [place]. Discussed newer goals. Patient Care Team: Madelin Headings, MD as PCP - General Patient Instructions   ttend to  lifestyle interventions.   Healthy lifestyle includes : At least 150 minutes of exercise weeks  , weight at healthy levels, which is usually   BMI 19-25. Avoid trans fats and processed foods;  Increase fresh fruits and veges to 5 servings per day. And avoid sweet beverages including tea and juice. Mediterranean diet with olive oil and nuts have been noted to be heart and brain healthy . Avoid tobacco products . Limit  alcohol to  7 per week for women and 14 servings for men.  Get adequate sleep . Wear seat belts . Don't text and drive .  Avoid eating late at night .   Stool screening tests for  Colon cancer  Yearly  Check in to  Coverage for cologuard  And if reimbursed and want to proceed let us know.    Yearly check up with labs  BP is up some today  Take blood pressure readings twice a day for 7- 10 days and then periodically .To ensure below 140/90   .Send in readings  The new goals are    Closer to 120/80 for   Healthy range     BP Readings from Last 3 Encounters:  02/21/16 (!) 142/96  09/18/15 110/70  06/15/15 124/80   Wt Readings from Last 3 Encounters:  02/21/16 187 lb 9.6 oz (85.1 kg)  09/18/15 181 lb 9 oz (82.4 kg)  06/15/15 186 lb (84.4 kg)    Health Maintenance, Male A healthy lifestyle and preventative care can promote health and wellness.  Maintain regular health,  dental, and eye exams.  Eat a healthy diet. Foods like vegetables, fruits, whole grains, low-fat dairy products, and lean protein foods contain the nutrients you need and are low in calories. Decrease your intake of foods high in solid fats, added sugars, and salt. Get information about a proper diet from your health care provider, if  necessary.  Regular physical exercise is one of the most important things you can do for your health. Most adults should get at least 150 minutes of moderate-intensity exercise (any activity that increases your heart rate and causes you to sweat) each week. In addition, most adults need muscle-strengthening exercises on 2 or more days a week.   Maintain a healthy weight. The body mass index (BMI) is a screening tool to identify possible weight problems. It provides an estimate of body fat based on height and weight. Your health care provider can find your BMI and can help you achieve or maintain a healthy weight. For males 20 years and older:  A BMI below 18.5 is considered underweight.  A BMI of 18.5 to 24.9 is normal.  A BMI of 25 to 29.9 is considered overweight.  A BMI of 30 and above is considered obese.  Maintain normal blood lipids and cholesterol by exercising and minimizing your intake of saturated fat. Eat a balanced diet with plenty of fruits and vegetables. Blood tests for lipids and cholesterol should begin at age 76 and be repeated every 5 years. If your lipid or cholesterol levels are high, you are over age 62, or you are at high risk for heart disease, you may need your cholesterol levels checked more frequently.Ongoing high lipid and cholesterol levels should be treated with medicines if diet and exercise are not working.  If you smoke, find out from your health care provider how to quit. If you do not use tobacco, do not start.  Lung cancer screening is recommended for adults aged 55-80 years who are at high risk for developing lung cancer because of a history of smoking. A yearly low-dose CT scan of the lungs is recommended for people who have at least a 30-pack-year history of smoking and are current smokers or have quit within the past 15 years. A pack year of smoking is smoking an average of 1 pack of cigarettes a day for 1 year (for example, a 30-pack-year history of  smoking could mean smoking 1 pack a day for 30 years or 2 packs a day for 15 years). Yearly screening should continue until the smoker has stopped smoking for at least 15 years. Yearly screening should be stopped for people who develop a health problem that would prevent them from having lung cancer treatment.  If you choose to drink alcohol, do not have more than 2 drinks per day. One drink is considered to be 12 oz (360 mL) of beer, 5 oz (150 mL) of wine, or 1.5 oz (45 mL) of liquor.  Avoid the use of street drugs. Do not share needles with anyone. Ask for help if you need support or instructions about stopping the use of drugs.  High blood pressure causes heart disease and increases the risk of stroke. High blood pressure is more likely to develop in:  People who have blood pressure in the end of the normal range (100-139/85-89 mm Hg).  People who are overweight or obese.  People who are African American.  If you are 41-57 years of age, have your blood pressure checked every 3-5 years. If you  are 24 years of age or older, have your blood pressure checked every year. You should have your blood pressure measured twice-once when you are at a hospital or clinic, and once when you are not at a hospital or clinic. Record the average of the two measurements. To check your blood pressure when you are not at a hospital or clinic, you can use:  An automated blood pressure machine at a pharmacy.  A home blood pressure monitor.  If you are 28-4 years old, ask your health care provider if you should take aspirin to prevent heart disease.  Diabetes screening involves taking a blood sample to check your fasting blood sugar level. This should be done once every 3 years after age 24 if you are at a normal weight and without risk factors for diabetes. Testing should be considered at a younger age or be carried out more frequently if you are overweight and have at least 1 risk factor for  diabetes.  Colorectal cancer can be detected and often prevented. Most routine colorectal cancer screening begins at the age of 12 and continues through age 76. However, your health care provider may recommend screening at an earlier age if you have risk factors for colon cancer. On a yearly basis, your health care provider may provide home test kits to check for hidden blood in the stool. A small camera at the end of a tube may be used to directly examine the colon (sigmoidoscopy or colonoscopy) to detect the earliest forms of colorectal cancer. Talk to your health care provider about this at age 62 when routine screening begins. A direct exam of the colon should be repeated every 5-10 years through age 54, unless early forms of precancerous polyps or small growths are found.  People who are at an increased risk for hepatitis B should be screened for this virus. You are considered at high risk for hepatitis B if:  You were born in a country where hepatitis B occurs often. Talk with your health care provider about which countries are considered high risk.  Your parents were born in a high-risk country and you have not received a shot to protect against hepatitis B (hepatitis B vaccine).  You have HIV or AIDS.  You use needles to inject street drugs.  You live with, or have sex with, someone who has hepatitis B.  You are a man who has sex with other men (MSM).  You get hemodialysis treatment.  You take certain medicines for conditions like cancer, organ transplantation, and autoimmune conditions.  Hepatitis C blood testing is recommended for all people born from [birth year] through 1963 and any individual with known risk factors for hepatitis C.  Healthy men should no longer receive prostate-specific antigen (PSA) blood tests as part of routine cancer screening. Talk to your health care provider about prostate cancer screening.  Testicular cancer screening is not recommended for adolescents or  adult males who have no symptoms. Screening includes self-exam, a health care provider exam, and other screening tests. Consult with your health care provider about any symptoms you have or any concerns you have about testicular cancer.  Practice safe sex. Use condoms and avoid high-risk sexual practices to reduce the spread of sexually transmitted infections (STIs).  You should be screened for STIs, including gonorrhea and chlamydia if:  You are sexually active and are younger than 24 years.  You are older than 24 years, and your health care provider tells you that you are at risk  for this type of infection.  Your sexual activity has changed since you were last screened, and you are at an increased risk for chlamydia or gonorrhea. Ask your health care provider if you are at risk.  If you are at risk of being infected with HIV, it is recommended that you take a prescription medicine daily to prevent HIV infection. This is called pre-exposure prophylaxis (PrEP). You are considered at risk if:  You are a man who has sex with other men (MSM).  You are a heterosexual man who is sexually active with multiple partners.  You take drugs by injection.  You are sexually active with a partner who has HIV.  Talk with your health care provider about whether you are at high risk of being infected with HIV. If you choose to begin PrEP, you should first be tested for HIV. You should then be tested every 3 months for as long as you are taking PrEP.  Use sunscreen. Apply sunscreen liberally and repeatedly throughout the day. You should seek shade when your shadow is shorter than you. Protect yourself by wearing long sleeves, pants, a wide-brimmed hat, and sunglasses year round whenever you are outdoors.  Tell your health care provider of new moles or changes in moles, especially if there is a change in shape or color. Also, tell your health care provider if a mole is larger than the size of a pencil  eraser.  A one-time screening for abdominal aortic aneurysm (AAA) and surgical repair of large AAAs by ultrasound is recommended for men aged 65-75 years who are current or former smokers.  Stay current with your vaccines (immunizations). This information is not intended to replace advice given to you by your health care provider. Make sure you discuss any questions you have with your health care provider. Document Released: 08/31/2007 Document Revised: 03/25/2014 Document Reviewed: 12/06/2014 Elsevier Interactive Patient Education  2017 [employer]     [place] CityWanda K. Reinhardt Licausi M.D.

## 2016-02-21 ENCOUNTER — Encounter: Payer: Self-pay | Admitting: Internal Medicine

## 2016-02-21 ENCOUNTER — Ambulatory Visit (INDEPENDENT_AMBULATORY_CARE_PROVIDER_SITE_OTHER): Payer: BC Managed Care – PPO | Admitting: Internal Medicine

## 2016-02-21 VITALS — BP 138/96 | Temp 97.9°F | Ht 61.0 in | Wt 187.6 lb

## 2016-02-21 DIAGNOSIS — Z6835 Body mass index (BMI) 35.0-35.9, adult: Secondary | ICD-10-CM

## 2016-02-21 DIAGNOSIS — Z1211 Encounter for screening for malignant neoplasm of colon: Secondary | ICD-10-CM

## 2016-02-21 DIAGNOSIS — Z0000 Encounter for general adult medical examination without abnormal findings: Secondary | ICD-10-CM

## 2016-02-21 DIAGNOSIS — Z79899 Other long term (current) drug therapy: Secondary | ICD-10-CM

## 2016-02-21 DIAGNOSIS — R030 Elevated blood-pressure reading, without diagnosis of hypertension: Secondary | ICD-10-CM | POA: Diagnosis not present

## 2016-02-21 DIAGNOSIS — Z23 Encounter for immunization: Secondary | ICD-10-CM

## 2016-02-21 MED ORDER — BECLOMETHASONE DIPROPIONATE 40 MCG/ACT IN AERS: 1.0000 | INHALATION_SPRAY | Freq: Every day | RESPIRATORY_TRACT | 11 refills | Status: DC

## 2016-02-21 MED ORDER — ALBUTEROL SULFATE HFA 108 (90 BASE) MCG/ACT IN AERS: 2.0000 | INHALATION_SPRAY | Freq: Four times a day (QID) | RESPIRATORY_TRACT | 2 refills | Status: DC | PRN

## 2016-02-21 NOTE — Patient Instructions (Addendum)
ttend to  lifestyle interventions.   Healthy lifestyle includes : At least 150 minutes of exercise weeks  , weight at healthy levels, which is usually   BMI 19-25. Avoid trans fats and processed foods;  Increase fresh fruits and veges to 5 servings per day. And avoid sweet beverages including tea and juice. Mediterranean diet with olive oil and nuts have been noted to be heart and brain healthy . Avoid tobacco products . Limit  alcohol to  7 per week for women and 14 servings for men.  Get adequate sleep . Wear seat belts . Don't text and drive .  Avoid eating late at night .   Stool screening tests for  Colon cancer  Yearly  Check in to  Coverage for cologuard  And if reimbursed and want to proceed let us know.    Yearly check up with labs  BP is up some today  Take blood pressure readings twice a day for 7- 10 days and then periodically .To ensure below 140/90   .Send in readings  The new goals are    Closer to 120/80 for   Healthy range     BP Readings from Last 3 Encounters:  02/21/16 (!) 142/96  09/18/15 110/70  06/15/15 124/80   Wt Readings from Last 3 Encounters:  02/21/16 187 lb 9.6 oz (85.1 kg)  09/18/15 181 lb 9 oz (82.4 kg)  06/15/15 186 lb (84.4 kg)    Health Maintenance, Male A healthy lifestyle and preventative care can promote health and wellness.  Maintain regular health, dental, and eye exams.  Eat a healthy diet. Foods like vegetables, fruits, whole grains, low-fat dairy products, and lean protein foods contain the nutrients you need and are low in calories. Decrease your intake of foods high in solid fats, added sugars, and salt. Get information about a proper diet from your health care provider, if necessary.  Regular physical exercise is one of the most important things you can do for your health. Most adults should get at least 150 minutes of moderate-intensity exercise (any activity that increases your heart rate and causes you to sweat) each week. In  addition, most adults need muscle-strengthening exercises on 2 or more days a week.   Maintain a healthy weight. The body mass index (BMI) is a screening tool to identify possible weight problems. It provides an estimate of body fat based on height and weight. Your health care provider can find your BMI and can help you achieve or maintain a healthy weight. For males 20 years and older:  A BMI below 18.5 is considered underweight.  A BMI of 18.5 to 24.9 is normal.  A BMI of 25 to 29.9 is considered overweight.  A BMI of 30 and above is considered obese.  Maintain normal blood lipids and cholesterol by exercising and minimizing your intake of saturated fat. Eat a balanced diet with plenty of fruits and vegetables. Blood tests for lipids and cholesterol should begin at age 58 and be repeated every 5 years. If your lipid or cholesterol levels are high, you are over age 58, or you are at high risk for heart disease, you may need your cholesterol levels checked more frequently.Ongoing high lipid and cholesterol levels should be treated with medicines if diet and exercise are not working.  If you smoke, find out from your health care provider how to quit. If you do not use tobacco, do not start.  Lung cancer screening is recommended for adults aged 58-80  years who are at high risk for developing lung cancer because of a history of smoking. A yearly low-dose CT scan of the lungs is recommended for people who have at least a 30-pack-year history of smoking and are current smokers or have quit within the past 15 years. A pack year of smoking is smoking an average of 1 pack of cigarettes a day for 1 year (for example, a 30-pack-year history of smoking could mean smoking 1 pack a day for 30 years or 2 packs a day for 15 years). Yearly screening should continue until the smoker has stopped smoking for at least 15 years. Yearly screening should be stopped for people who develop a health problem that would  prevent them from having lung cancer treatment.  If you choose to drink alcohol, do not have more than 2 drinks per day. One drink is considered to be 12 oz (360 mL) of beer, 5 oz (150 mL) of wine, or 1.5 oz (45 mL) of liquor.  Avoid the use of street drugs. Do not share needles with anyone. Ask for help if you need support or instructions about stopping the use of drugs.  High blood pressure causes heart disease and increases the risk of stroke. High blood pressure is more likely to develop in:  People who have blood pressure in the end of the normal range (100-139/85-89 mm Hg).  People who are overweight or obese.  People who are African American.  If you are 22-60 years of age, have your blood pressure checked every 3-5 years. If you are 56 years of age or older, have your blood pressure checked every year. You should have your blood pressure measured twice-once when you are at a hospital or clinic, and once when you are not at a hospital or clinic. Record the average of the two measurements. To check your blood pressure when you are not at a hospital or clinic, you can use:  An automated blood pressure machine at a pharmacy.  A home blood pressure monitor.  If you are 68-58 years old, ask your health care provider if you should take aspirin to prevent heart disease.  Diabetes screening involves taking a blood sample to check your fasting blood sugar level. This should be done once every 3 years after age 63 if you are at a normal weight and without risk factors for diabetes. Testing should be considered at a younger age or be carried out more frequently if you are overweight and have at least 1 risk factor for diabetes.  Colorectal cancer can be detected and often prevented. Most routine colorectal cancer screening begins at the age of 6 and continues through age 66. However, your health care provider may recommend screening at an earlier age if you have risk factors for colon cancer. On  a yearly basis, your health care provider may provide home test kits to check for hidden blood in the stool. A small camera at the end of a tube may be used to directly examine the colon (sigmoidoscopy or colonoscopy) to detect the earliest forms of colorectal cancer. Talk to your health care provider about this at age 53 when routine screening begins. A direct exam of the colon should be repeated every 5-10 years through age 39, unless early forms of precancerous polyps or small growths are found.  People who are at an increased risk for hepatitis B should be screened for this virus. You are considered at high risk for hepatitis B if:  You  were born in a country where hepatitis B occurs often. Talk with your health care provider about which countries are considered high risk.  Your parents were born in a high-risk country and you have not received a shot to protect against hepatitis B (hepatitis B vaccine).  You have HIV or AIDS.  You use needles to inject street drugs.  You live with, or have sex with, someone who has hepatitis B.  You are a man who has sex with other men (MSM).  You get hemodialysis treatment.  You take certain medicines for conditions like cancer, organ transplantation, and autoimmune conditions.  Hepatitis C blood testing is recommended for all people born from [id number] through 1965 and any individual with known risk factors for hepatitis C.  Healthy men should no longer receive prostate-specific antigen (PSA) blood tests as part of routine cancer screening. Talk to your health care provider about prostate cancer screening.  Testicular cancer screening is not recommended for adolescents or adult males who have no symptoms. Screening includes self-exam, a health care provider exam, and other screening tests. Consult with your health care provider about any symptoms you have or any concerns you have about testicular cancer.  Practice safe sex. Use condoms and avoid high-risk  sexual practices to reduce the spread of sexually transmitted infections (STIs).  You should be screened for STIs, including gonorrhea and chlamydia if:  You are sexually active and are younger than 24 years.  You are older than 24 years, and your health care provider tells you that you are at risk for this type of infection.  Your sexual activity has changed since you were last screened, and you are at an increased risk for chlamydia or gonorrhea. Ask your health care provider if you are at risk.  If you are at risk of being infected with HIV, it is recommended that you take a prescription medicine daily to prevent HIV infection. This is called pre-exposure prophylaxis (PrEP). You are considered at risk if:  You are a man who has sex with other men (MSM).  You are a heterosexual man who is sexually active with multiple partners.  You take drugs by injection.  You are sexually active with a partner who has HIV.  Talk with your health care provider about whether you are at high risk of being infected with HIV. If you choose to begin PrEP, you should first be tested for HIV. You should then be tested every 3 months for as long as you are taking PrEP.  Use sunscreen. Apply sunscreen liberally and repeatedly throughout the day. You should seek shade when your shadow is shorter than you. Protect yourself by wearing long sleeves, pants, a wide-brimmed hat, and sunglasses year round whenever you are outdoors.  Tell your health care provider of new moles or changes in moles, especially if there is a change in shape or color. Also, tell your health care provider if a mole is larger than the size of a pencil eraser.  A one-time screening for abdominal aortic aneurysm (AAA) and surgical repair of large AAAs by ultrasound is recommended for men aged 65-75 years who are current or former smokers.  Stay current with your vaccines (immunizations). This information is not intended to replace advice  given to you by your health care provider. Make sure you discuss any questions you have with your health care provider. Document Released: 08/31/2007 Document Revised: 03/25/2014 Document Reviewed: 12/06/2014 Elsevier Interactive Patient Education  2017 [employer]

## 2016-02-21 NOTE — Addendum Note (Signed)
Addended by: Bonnye FavaKWEI, NANA K on: 02/21/2016 01:29 PM   Modules accepted: Orders

## 2016-02-26 LAB — FECAL OCCULT BLOOD, IMMUNOCHEMICAL: Fecal Occult Bld: NEGATIVE

## 2016-03-15 NOTE — Progress Notes (Signed)
Inform patient stool test negative for blood . Need to do  Yearly   tests

## 2016-03-20 ENCOUNTER — Ambulatory Visit (INDEPENDENT_AMBULATORY_CARE_PROVIDER_SITE_OTHER): Payer: BC Managed Care – PPO | Admitting: Neurology

## 2016-03-20 ENCOUNTER — Encounter: Payer: Self-pay | Admitting: Neurology

## 2016-03-20 VITALS — BP 122/82 | HR 112 | Ht 61.0 in | Wt 185.9 lb

## 2016-03-20 DIAGNOSIS — G43009 Migraine without aura, not intractable, without status migrainosus: Secondary | ICD-10-CM

## 2016-03-20 NOTE — Patient Instructions (Signed)
1.  No change in management.    Amitriptyline 50mg  at bedtime  Take Excedrin or sumatriptan as needed 2.  Contact Dr. Rosezella FloridaPanosh's office if stomach flu does not continue to improve.  Drink water. 3.  Follow up in one year.

## 2016-03-20 NOTE — Progress Notes (Signed)
NEUROLOGY FOLLOW UP OFFICE NOTE  [place] [facility] [id number]  HISTORY OF PRESENT ILLNESS: [name] is a 61 year old left-handed man with history of migraines and asthma who follows up for migraine.   UPDATE: Intensity:  No more than 5-6/10 Duration:  Usually 1 hour with abortive medication Frequency:  2 to 3 days a month. Abortive therapy: 1st line- Excedrin Migraine; 2nd line- sumatriptan 100mg  Supplements/vitamins:  Iaso Tea, Chaga, Gano, Omega 3, B6 and B12 and other supplements.    Antidepressant:  amitriptyline 50mg  Abortive:  Usually Excedrin Muscle relaxant:  Robaxin  He has had a bug over the past week.  He reports nausea and vomiting with chills and sore throat with subjective fever.  He no longer is vomiting.     HISTORY: Onset:  61 years old Location:  Variable.  Most recently, left-sided Quality:  Pressure, throbbing Initial intensity:  2-10/10 (usually 6-7/10); July: no more than 5-6/10 Aura:  no Prodrome:  yawning Associated symptoms:  Nausea, dizziness, phonophobia, sometimes sees spots, photophobia. Initial Duration:  2 days (however this current headache has been going on for almost a week); July: Usually one hour with Excedrin depending on intensity.  It could be longer. Initial Frequency:  8-10 days per month; July: 5 days per month. Triggers/exacerbating factors:  Smell of burning rope Relieving factors:  none Activity:  Needs to lay down 4-5 days per month   Past abortive therapy:  Tylenol, ibuprofen, Benadryl, naproxen, Excedrin Migraine (helped), Fioricet Past preventative therapy:  topiramate 100mg  (stopped due to concern of potential side effects)   MRI of the brain with and without contrast was performed on 02/04/14, which showed non-specific white matter signal changes but nothing acute.  PAST MEDICAL HISTORY: Past Medical History:  Diagnosis Date  . Allergic rhinitis   . Asthma   . Chicken pox    as a child  . Migraines   . Umbilical  hernia     MEDICATIONS: Current Outpatient Prescriptions on File Prior to Visit  Medication Sig Dispense Refill  . albuterol (PROVENTIL HFA;VENTOLIN HFA) 108 (90 Base) MCG/ACT inhaler Inhale 2 puffs into the lungs every 6 (six) hours as needed for wheezing or shortness of breath. 1 Inhaler 2  . amitriptyline (ELAVIL) 50 MG tablet Take 1 tablet (50 mg total) by mouth at bedtime. 30 tablet 5  . aspirin-acetaminophen-caffeine (EXCEDRIN MIGRAINE) 250-250-65 MG per tablet Take 2 tablets by mouth daily as needed for headache.    . beclomethasone (QVAR) 40 MCG/ACT inhaler Inhale 1-2 puffs into the lungs daily. Or as directed 1 Inhaler 11  . cetirizine (ZYRTEC) 10 MG tablet Take 10 mg by mouth daily.    . Cholecalciferol (VITAMIN D3) 1000 UNITS CAPS Take 1 capsule by mouth daily.    . Flaxseed MISC by Does not apply route. Whole ground    . FLAXSEED, LINSEED, PO Take by mouth.    . Omega-3 Fatty Acids (OMEGA 3 PO) Take 1 capsule by mouth 2 (two) times daily.    Marland Kitchen. omeprazole (PRILOSEC) 20 MG capsule Take 1 capsule (20 mg total) by mouth daily. Or as directed 30 capsule 5  . OVER THE COUNTER MEDICATION chaga 100 % pure    . OVER THE COUNTER MEDICATION NRG:  B6-B12 ONCE DAILY    . OVER THE COUNTER MEDICATION [place]    . OVER THE COUNTER MEDICATION GANO 100% PURE EXTRACT    . OVER THE COUNTER MEDICATION AURAGIN:  THREE TABS ONCE OR TWICE DAILY    .  Probiotic Product (PROBIOTIC PO) Take 1 capsule by mouth daily. Green Vibrance    . SUMAtriptan (IMITREX) 100 MG tablet Take 1 tablet at earliest onset of headache.  May repeat once in 2 hours if headache persists or recurs.  Do not exceed 2 tablets in 24hrs 10 tablet 5  . triamcinolone (NASACORT ALLERGY 24HR) 55 MCG/ACT AERO nasal inhaler Place 2 sprays into the nose daily.    . TURMERIC PO Take 1 tablet by mouth 3 (three) times daily.    . WHEY PROTEIN PO Take 60 g by mouth.     No current facility-administered medications on file prior to visit.      ALLERGIES: No Known Allergies  FAMILY HISTORY: Family History  Problem Relation Age of Onset  . Diabetes Mellitus II Mother   . Thyroid disease    . Hypertension    . Thyroid disease      SOCIAL HISTORY: Social History   Social History  . Marital status: Single    Spouse name: N/A  . Number of children: N/A  . Years of education: N/A   Occupational History  . Not on file.   Social History Main Topics  . Smoking status: Never Smoker  . Smokeless tobacco: Never Used  . Alcohol use No  . Drug use: No  . Sexual activity: No   Other Topics Concern  . 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    No etoh and ocass caffine              REVIEW OF SYSTEMS: Constitutional: No fevers, chills, or sweats, no generalized fatigue, change in appetite Eyes: No visual changes, double vision, eye pain Ear, nose and throat: No hearing loss, ear pain, nasal congestion, sore throat Cardiovascular: No chest pain, palpitations Respiratory:  No shortness of breath at rest or with exertion, wheezes GastrointestinaI: some nausea Genitourinary:  No dysuria, urinary retention or frequency Musculoskeletal:  No neck pain, back pain Integumentary: No rash, pruritus, skin lesions Neurological: as above Psychiatric: No depression, insomnia, anxiety Endocrine: No palpitations, fatigue, diaphoresis, mood swings, change in appetite, change in weight, increased thirst Hematologic/Lymphatic:  No purpura, petechiae. Allergic/Immunologic: no itchy/runny eyes, nasal congestion, recent allergic reactions, rashes  PHYSICAL EXAM: Vitals:   03/20/16 1043  BP: 122/82  Pulse: (!) 112   General: No acute distress.  Patient appears well-groomed.   Head:  Normocephalic/atraumatic Eyes:  Fundi examined but not visualized Neck: supple, no paraspinal tenderness, full range of  motion Heart:  Regular rate and rhythm Lungs:  Clear to auscultation bilaterally Back: No paraspinal tenderness Neurological Exam: alert and oriented to person, place, and time. Attention span and concentration intact, recent and remote memory intact, fund of knowledge intact.  Speech fluent and not dysarthric, language intact.  CN II-XII intact. Bulk and tone normal, muscle strength 5/5 throughout.  Sensation to light touch  intact.  Deep tendon reflexes 2+ throughout.  Finger to nose testing intact.  Gait normal  IMPRESSION: Migraine without aura, stable  PLAN: 1.  Continue amitriptyline 50mg  at bedtime and supplements 2.  Excedrin and sumatriptan for abortive therapy 3.  Keep hydrated and if he doesn't continue to improve, then to contact his PCP. 4.  Follow up in one year  Shon Millet, DO  CC:  Berniece Andreas, MD

## 2016-04-22 NOTE — Progress Notes (Signed)
Pre visit review using our clinic review tool, if applicable. No additional management support is needed unless otherwise documented below in the visit note.  Chief Complaint  Patient presents with  . Follow-up    HPI: [name] 61 y.o.    bp fu  Under rx for  Migraine has  He brings in copies of his blood pressure deans about 10 days. He did not bring his monitor in today. Headaches he is trying to manage with less medicine doing okay thinks his blood pressure goes up he has a headache.   ROS: See pertinent positives and negatives per HPI.  Past Medical History:  Diagnosis Date  . Allergic rhinitis   . Asthma   . Chicken pox    as a child  . Migraines   . Umbilical hernia     Family History  Problem Relation Age of Onset  . Diabetes Mellitus II Mother   . Thyroid disease    . Hypertension    . Thyroid disease      Social History   Social History  . Marital status: Single    Spouse name: N/A  . Number of children: N/A  . Years of education: N/A   Social History Main Topics  . Smoking status: Never Smoker  . Smokeless tobacco: Never Used  . Alcohol use No  . Drug use: No  . Sexual activity: No   Other Topics Concern  . 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 albuterol (PROVENTIL HFA;VENTOLIN HFA) 108 (90 Base) MCG/ACT inhaler Inhale 2 puffs into the lungs every 6 (six) hours as needed for wheezing or shortness of breath. 1 Inhaler 2  . amitriptyline (ELAVIL) 50 MG tablet Take 1 tablet (50 mg total) by mouth at bedtime. 30 tablet 5  . aspirin-acetaminophen-caffeine (EXCEDRIN MIGRAINE) 250-250-65 MG per tablet Take 2 tablets by mouth daily as needed for headache.    . beclomethasone (QVAR) 40 MCG/ACT inhaler Inhale 1-2 puffs  into the lungs daily. Or as directed 1 Inhaler 11  . cetirizine (ZYRTEC) 10 MG tablet Take 10 mg by mouth daily.    . Cholecalciferol (VITAMIN D3) 1000 UNITS CAPS Take 1 capsule by mouth daily.    . Flaxseed MISC by Does not apply route. Whole ground    . Omega-3 Fatty Acids (OMEGA 3 PO) Take 1 capsule by mouth 2 (two) times daily.    Marland Kitchen. omeprazole (PRILOSEC) 20 MG capsule Take 1 capsule (20 mg total) by mouth daily. Or as directed 30 capsule 5  . OVER THE COUNTER MEDICATION chaga 100 % pure    . OVER THE COUNTER MEDICATION NRG:  B6-B12 ONCE DAILY    . OVER THE COUNTER MEDICATION [place]    . OVER THE COUNTER MEDICATION GANO 100% PURE EXTRACT    . Probiotic Product (PROBIOTIC PO) Take 1 capsule by mouth daily. Green Vibrance    . SUMAtriptan (IMITREX) 100 MG tablet Take 1 tablet at earliest onset of headache.  May repeat once in 2 hours if headache persists or recurs.  Do not exceed 2 tablets in 24hrs 10 tablet 5  . triamcinolone (NASACORT  ALLERGY 24HR) 55 MCG/ACT AERO nasal inhaler Place 2 sprays into the nose daily.    . TURMERIC PO Take 1 tablet by mouth 3 (three) times daily.    . WHEY PROTEIN PO Take 60 g by mouth.    Marland Kitchen FLAXSEED, LINSEED, PO Take by mouth.    Marland Kitchen OVER THE COUNTER MEDICATION AURAGIN:  THREE TABS ONCE OR TWICE DAILY     No facility-administered medications prior to visit.      EXAM:  BP 136/82 (BP Location: Right Arm, Patient Position: Sitting, Cuff Size: Normal)   Temp 98.2 F (36.8 C) (Oral)   Wt 183 lb (83 kg)   BMI 34.58 kg/m   Body mass index is 34.58 kg/m.  GENERAL: vitals reviewed and listed above, alert, oriented, appears well hydrated and in no acute distress HEENT: atraumatic, conjunctiva  clear, no obvious abnormalities on inspection of external nose and earsPSYCH: pleasant and cooperative, no obvious depression or anxiety BP Readings from Last 3 Encounters:  04/23/16 136/82  03/20/16 122/82  02/21/16 (!) 138/96   Wt Readings from Last 3  Encounters:  04/23/16 183 lb (83 kg)  03/20/16 185 lb 14.4 oz (84.3 kg)  02/21/16 187 lb 9.6 oz (85.1 kg)   Lab Results  Component Value Date   WBC 6.1 02/14/2016   HGB 14.6 02/14/2016   HCT 44.0 02/14/2016   PLT 211.0 02/14/2016   GLUCOSE 88 02/14/2016   CHOL 172 02/14/2016   TRIG 54.0 02/14/2016   HDL 66.60 02/14/2016   LDLCALC 94 02/14/2016   ALT 41 02/14/2016   AST 31 02/14/2016   NA 141 02/14/2016   K 4.8 02/14/2016   CL 103 02/14/2016   CREATININE 1.06 02/14/2016   BUN 19 02/14/2016   CO2 33 (H) 02/14/2016   TSH 0.83 02/14/2016   PSA 0.58 02/14/2016   HGBA1C 5.7 09/13/2013  bp left sitting reg 130/82  And 124/78  Large  Wt Readings from Last 3 Encounters:  04/23/16 183 lb (83 kg)  03/20/16 185 lb 14.4 oz (84.3 kg)  02/21/16 187 lb 9.6 oz (85.1 kg)    ASSESSMENT AND PLAN:  Discussed the following assessment and plan:  Elevated BP without diagnosis of hypertension bp ok  Today the majority of his readings are acceptable at home below 130/85. Discussed how to monitor and continue lifestyle need diagnosis guidelines. He can send in readings my chart otherwise keep his regularly scheduled appointments. Expectant management. Continue healthy weight loss  -Patient advised to return or notify health care team  if symptoms worsen ,persist or new concerns arise. Total visit > 50% spent counseling and coordinating care as indicated in above note and in instructions to patient .    Patient Instructions   BP  readings are   acceptable at this time   Every month  Take 3 days of twice a day readings to make sure at goal 130/80 range or lower .   Get your monitor     To   Any medical visit  To correlate this with    Office  Machines  One time .   You can send in readings for me to review on my chart .   If needed .     Neta Mends. Jlynn Langille M.D.

## 2016-04-23 ENCOUNTER — Encounter: Payer: Self-pay | Admitting: Internal Medicine

## 2016-04-23 ENCOUNTER — Ambulatory Visit (INDEPENDENT_AMBULATORY_CARE_PROVIDER_SITE_OTHER): Payer: BC Managed Care – PPO | Admitting: Internal Medicine

## 2016-04-23 VITALS — BP 136/82 | Temp 98.2°F | Wt 183.0 lb

## 2016-04-23 DIAGNOSIS — R030 Elevated blood-pressure reading, without diagnosis of hypertension: Secondary | ICD-10-CM | POA: Diagnosis not present

## 2016-04-23 NOTE — Patient Instructions (Addendum)
  BP  readings are   acceptable at this time   Every month  Take 3 days of twice a day readings to make sure at goal 130/80 range or lower .   Get your monitor     To   Any medical visit  To correlate this with    Office  Machines  One time .   You can send in readings for me to review on my chart .   If needed .

## 2016-05-14 ENCOUNTER — Telehealth: Payer: Self-pay

## 2016-05-14 NOTE — Telephone Encounter (Signed)
PA resubmitted. [id number]:[id number]

## 2016-05-14 NOTE — Telephone Encounter (Signed)
PA not needed per insurance, they requested that pharmacy contact them. Form faxed back to pharmacy.

## 2016-05-14 NOTE — Telephone Encounter (Signed)
[facility] PA submitted & is pending. BJY:[id number]:[id number]

## 2016-05-15 NOTE — Telephone Encounter (Signed)
PA approved, form faxed back to pharmacy. 

## 2016-07-25 ENCOUNTER — Encounter: Payer: Self-pay | Admitting: Internal Medicine

## 2016-07-31 ENCOUNTER — Encounter: Payer: Self-pay | Admitting: Internal Medicine

## 2016-08-01 ENCOUNTER — Telehealth: Payer: Self-pay | Admitting: Emergency Medicine

## 2016-08-01 ENCOUNTER — Other Ambulatory Visit: Payer: Self-pay | Admitting: Emergency Medicine

## 2016-08-01 MED ORDER — ALBUTEROL SULFATE HFA 108 (90 BASE) MCG/ACT IN AERS: 2.0000 | INHALATION_SPRAY | Freq: Four times a day (QID) | RESPIRATORY_TRACT | 1 refills | Status: DC | PRN

## 2016-08-01 NOTE — Telephone Encounter (Signed)
Can refill medication x 1   Ask if he is taking the  q var also   If having to use rescue medication  More than   2  X per week Make ov to discuss   Asthma management

## 2016-08-01 NOTE — Telephone Encounter (Signed)
Spoke with pt and he states that his usage with the albuterol depends on the weather and doesn't feel like he needs to come in at this time. Advised pt that if asthma gets worse to give the office a call back so we can get him an appointment

## 2016-08-01 NOTE — Telephone Encounter (Signed)
Left VM for pt regarding medication. If he is using the rescue medication more than 2x weekly if so he needs to make an Ov to discuss Asthma management

## 2016-08-06 ENCOUNTER — Other Ambulatory Visit: Payer: Self-pay | Admitting: Internal Medicine

## 2016-08-06 MED ORDER — ALBUTEROL SULFATE HFA 108 (90 BASE) MCG/ACT IN AERS: 2.0000 | INHALATION_SPRAY | Freq: Four times a day (QID) | RESPIRATORY_TRACT | 1 refills | Status: AC | PRN

## 2016-08-09 ENCOUNTER — Telehealth: Payer: Self-pay | Admitting: Family Medicine

## 2016-08-09 NOTE — Telephone Encounter (Signed)
FYI..I can send in just letting you know

## 2016-08-09 NOTE — Telephone Encounter (Signed)
Received a fax from the pharmacy.  albuterol (PROVENTIL HFA;VENTOLIN HFA) 108 (90 Base) MCG/ACT inhaler is no longer covered by the pt's insurance.  Preferred drugs include levalbuterol AER, ProAir HFA, ProAir Respi AER.  Please have physician choose a covered alternative.

## 2016-08-09 NOTE — Telephone Encounter (Signed)
Please advise 

## 2016-08-09 NOTE — Telephone Encounter (Signed)
Ok [facility] whichever is less expensive

## 2016-08-13 ENCOUNTER — Other Ambulatory Visit: Payer: Self-pay | Admitting: Emergency Medicine

## 2016-08-13 MED ORDER — ALBUTEROL SULFATE 108 (90 BASE) MCG/ACT IN AEPB: 2.0000 | INHALATION_SPRAY | Freq: Four times a day (QID) | RESPIRATORY_TRACT | 0 refills | Status: AC | PRN

## 2016-08-13 NOTE — Telephone Encounter (Signed)
Proair resiclick has been sent to pt pharmacy

## 2016-10-23 ENCOUNTER — Other Ambulatory Visit: Payer: Self-pay | Admitting: Neurology

## 2016-12-06 ENCOUNTER — Encounter: Payer: Self-pay | Admitting: Internal Medicine

## 2017-03-05 ENCOUNTER — Other Ambulatory Visit: Payer: BC Managed Care – PPO
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
## 2017-03-12 ENCOUNTER — Ambulatory Visit (INDEPENDENT_AMBULATORY_CARE_PROVIDER_SITE_OTHER): Payer: BC Managed Care – PPO | Admitting: Internal Medicine

## 2017-03-12 ENCOUNTER — Encounter: Payer: Self-pay | Admitting: Internal Medicine

## 2017-03-12 VITALS — BP 150/90 | HR 104 | Temp 97.8°F | Ht 60.75 in | Wt 181.7 lb

## 2017-03-12 DIAGNOSIS — Z6834 Body mass index (BMI) 34.0-34.9, adult: Secondary | ICD-10-CM | POA: Diagnosis not present

## 2017-03-12 DIAGNOSIS — Z1211 Encounter for screening for malignant neoplasm of colon: Secondary | ICD-10-CM

## 2017-03-12 DIAGNOSIS — K429 Umbilical hernia without obstruction or gangrene: Secondary | ICD-10-CM | POA: Diagnosis not present

## 2017-03-12 DIAGNOSIS — R030 Elevated blood-pressure reading, without diagnosis of hypertension: Secondary | ICD-10-CM

## 2017-03-12 DIAGNOSIS — Z23 Encounter for immunization: Secondary | ICD-10-CM | POA: Diagnosis not present

## 2017-03-12 DIAGNOSIS — Z79899 Other long term (current) drug therapy: Secondary | ICD-10-CM

## 2017-03-12 DIAGNOSIS — J45909 Unspecified asthma, uncomplicated: Secondary | ICD-10-CM

## 2017-03-12 DIAGNOSIS — Z0000 Encounter for general adult medical examination without abnormal findings: Secondary | ICD-10-CM

## 2017-03-12 DIAGNOSIS — G43009 Migraine without aura, not intractable, without status migrainosus: Secondary | ICD-10-CM

## 2017-03-12 LAB — CBC WITH DIFFERENTIAL/PLATELET
BASOS PCT: 0.5 % (ref 0.0–3.0)
Basophils Absolute: 0 10*3/uL (ref 0.0–0.1)
EOS PCT: 2.2 % (ref 0.0–5.0)
Eosinophils Absolute: 0.1 10*3/uL (ref 0.0–0.7)
HEMATOCRIT: 45.5 % (ref 39.0–52.0)
HEMOGLOBIN: 15.1 g/dL (ref 13.0–17.0)
LYMPHS PCT: 29.9 % (ref 12.0–46.0)
Lymphs Abs: 1.5 10*3/uL (ref 0.7–4.0)
MCHC: 33.2 g/dL (ref 30.0–36.0)
MCV: 97.7 fl (ref 78.0–100.0)
MONO ABS: 0.3 10*3/uL (ref 0.1–1.0)
Monocytes Relative: 6.8 % (ref 3.0–12.0)
Neutro Abs: 3.1 10*3/uL (ref 1.4–7.7)
Neutrophils Relative %: 60.6 % (ref 43.0–77.0)
Platelets: 239 10*3/uL (ref 150.0–400.0)
RBC: 4.66 Mil/uL (ref 4.22–5.81)
RDW: 13.8 % (ref 11.5–15.5)
WBC: 5 10*3/uL (ref 4.0–10.5)

## 2017-03-12 LAB — LIPID PANEL
CHOL/HDL RATIO: 3
Cholesterol: 155 mg/dL (ref 0–200)
HDL: 61.7 mg/dL (ref >39.00)
LDL CALC: 81 mg/dL (ref 0–99)
NONHDL: 93.15
Triglycerides: 59 mg/dL (ref 0.0–149.0)
VLDL: 11.8 mg/dL (ref 0.0–40.0)

## 2017-03-12 LAB — BASIC METABOLIC PANEL
BUN: 10 mg/dL (ref 6–23)
CHLORIDE: 102 meq/L (ref 96–112)
CO2: 31 mEq/L (ref 19–32)
Calcium: 9.1 mg/dL (ref 8.4–10.5)
Creatinine, Ser: 1.2 mg/dL (ref 0.40–1.50)
GFR: 79.49 mL/min (ref >60.00)
Glucose, Bld: 95 mg/dL (ref 70–99)
POTASSIUM: 4.1 meq/L (ref 3.5–5.1)
Sodium: 139 mEq/L (ref 135–145)

## 2017-03-12 LAB — HEPATIC FUNCTION PANEL
ALT: 33 U/L (ref 0–53)
AST: 29 U/L (ref 0–37)
Albumin: 4.2 g/dL (ref 3.5–5.2)
Alkaline Phosphatase: 66 U/L (ref 39–117)
Bilirubin, Direct: 0.1 mg/dL (ref 0.0–0.3)
Total Bilirubin: 0.7 mg/dL (ref 0.2–1.2)
Total Protein: 6.6 g/dL (ref 6.0–8.3)

## 2017-03-12 NOTE — Patient Instructions (Addendum)
bp goals are now   below 135/85   Get readings   5-10 days in a row  Twice a day  And send in readings . Dvise  adding medication   If still up .   Mediterranean eating .    [place]  Look into cologuard   For colon cancer screening .   Otherwise do   Stool  Yearly cards  In the interim .  Check wit h  insurance about  Asthma medication  Preferred or tiers  To help with cost  2019 formulary .  ROV depending on  Lab and BP readings .       Preventive Care 40-64 Years, Male Preventive care refers to lifestyle choices and visits with your health care provider that can promote health and wellness. What does preventive care include?  A yearly physical exam. This is also called an annual well check.  Dental exams once or twice a year.  Routine eye exams. Ask your health care provider how often you should have your eyes checked.  Personal lifestyle choices, including: ? Daily care of your teeth and gums. ? Regular physical activity. ? Eating a healthy diet. ? Avoiding tobacco and drug use. ? Limiting alcohol use. ? Practicing safe sex. ? Taking low-dose aspirin every day starting at age 54. What happens during an annual well check? The services and screenings done by your health care provider during your annual well check will depend on your age, overall health, lifestyle risk factors, and family history of disease. Counseling Your health care provider may ask you questions about your:  Alcohol use.  Tobacco use.  Drug use.  Emotional well-being.  Home and relationship well-being.  Sexual activity.  Eating habits.  Work and work Statistician.  Screening You may have the following tests or measurements:  Height, weight, and BMI.  Blood pressure.  Lipid and cholesterol levels. These may be checked every 5 years, or more frequently if you are over 52 years old.  Skin check.  Lung cancer screening. You may have this screening every year starting at age 33 if you have a  30-pack-year history of smoking and currently smoke or have quit within the past 15 years.  Fecal occult blood test (FOBT) of the stool. You may have this test every year starting at age 60.  Flexible sigmoidoscopy or colonoscopy. You may have a sigmoidoscopy every 5 years or a colonoscopy every 10 years starting at age 91.  Prostate cancer screening. Recommendations will vary depending on your family history and other risks.  Hepatitis C blood test.  Hepatitis B blood test.  Sexually transmitted disease (STD) testing.  Diabetes screening. This is done by checking your blood sugar (glucose) after you have not eaten for a while (fasting). You may have this done every 1-3 years.  Discuss your test results, treatment options, and if necessary, the need for more tests with your health care provider. Vaccines Your health care provider may recommend certain vaccines, such as:  Influenza vaccine. This is recommended every year.  Tetanus, diphtheria, and acellular pertussis (Tdap, Td) vaccine. You may need a Td booster every 10 years.  Varicella vaccine. You may need this if you have not been vaccinated.  Zoster vaccine. You may need this after age 74.  Measles, mumps, and rubella (MMR) vaccine. You may need at least one dose of MMR if you were born in 1957 or later. You may also need a second dose.  Pneumococcal 13-valent conjugate (PCV13) vaccine.  You may need this if you have certain conditions and have not been vaccinated.  Pneumococcal polysaccharide (PPSV23) vaccine. You may need one or two doses if you smoke cigarettes or if you have certain conditions.  Meningococcal vaccine. You may need this if you have certain conditions.  Hepatitis A vaccine. You may need this if you have certain conditions or if you travel or work in places where you may be exposed to hepatitis A.  Hepatitis B vaccine. You may need this if you have certain conditions or if you travel or work in places where  you may be exposed to hepatitis B.  Haemophilus influenzae type b (Hib) vaccine. You may need this if you have certain risk factors.  Talk to your health care provider about which screenings and vaccines you need and how often you need them. This information is not intended to replace advice given to you by your health care provider. Make sure you discuss any questions you have with your health care provider. Document Released: 03/31/2015 Document Revised: 11/22/2015 Document Reviewed: 01/03/2015 Elsevier Interactive Patient Education  [employer].

## 2017-03-20 ENCOUNTER — Ambulatory Visit: Payer: BC Managed Care – PPO | Admitting: Neurology

## 2017-03-20 ENCOUNTER — Encounter: Payer: Self-pay | Admitting: Neurology

## 2017-03-20 VITALS — BP 102/76 | HR 98 | Ht 61.75 in | Wt 180.6 lb

## 2017-03-20 DIAGNOSIS — G43009 Migraine without aura, not intractable, without status migrainosus: Secondary | ICD-10-CM | POA: Diagnosis not present

## 2017-03-20 MED ORDER — AMITRIPTYLINE HCL 25 MG PO TABS: 75.0000 mg | ORAL_TABLET | Freq: Every day | ORAL | 2 refills | Status: DC

## 2017-03-20 MED ORDER — SUMATRIPTAN SUCCINATE 100 MG PO TABS: ORAL_TABLET | ORAL | 5 refills | Status: DC

## 2017-03-20 NOTE — Patient Instructions (Signed)
1.  Increase amitriptyline to 75mg  at bedtime.  If headaches not improved in 6 weeks, contact me and we can increase dose. 2.  Take sumatriptan at earliest onset of migraine (take first before Excedrin).  May repeat dose once in 2 hours if needed (not to exceed two tablets in 24 hours). 3.  Follow up in 3 months.
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
## 2017-03-28 ENCOUNTER — Other Ambulatory Visit (INDEPENDENT_AMBULATORY_CARE_PROVIDER_SITE_OTHER): Payer: BC Managed Care – PPO

## 2017-03-28 DIAGNOSIS — Z1211 Encounter for screening for malignant neoplasm of colon: Secondary | ICD-10-CM | POA: Diagnosis not present

## 2017-03-28 LAB — FECAL OCCULT BLOOD, IMMUNOCHEMICAL: FECAL OCCULT BLD: NEGATIVE

## 2017-05-24 ENCOUNTER — Other Ambulatory Visit: Payer: Self-pay | Admitting: Internal Medicine

## 2017-06-19 ENCOUNTER — Ambulatory Visit: Payer: BC Managed Care – PPO | Admitting: Neurology

## 2017-06-19 ENCOUNTER — Encounter: Payer: Self-pay | Admitting: Neurology

## 2017-06-19 VITALS — BP 116/72 | HR 95 | Ht 62.0 in | Wt 178.0 lb

## 2017-06-19 DIAGNOSIS — G43009 Migraine without aura, not intractable, without status migrainosus: Secondary | ICD-10-CM

## 2017-06-19 MED ORDER — AMITRIPTYLINE HCL 25 MG PO TABS: 100.0000 mg | ORAL_TABLET | Freq: Every day | ORAL | 2 refills | Status: DC

## 2017-06-19 NOTE — Progress Notes (Signed)
NEUROLOGY FOLLOW UP OFFICE NOTE  [name] [id number]  HISTORY OF PRESENT ILLNESS: [name] Mceachin is a 62 year old left-handed man with history of migraines and asthma who follows up for migraine.   UPDATE: Last visit, amitriptyline was increased from 50mg  to 75mg  at bedtime.  Intensity:  No more than 5/10 Duration:  Usually 1 to 2 hours with abortive medication but sometimes longer if sumatriptan not taken at immediate onset. Frequency:  4 days a week Abortive therapy: 1st line- Excedrin Migraine; 2nd line- sumatriptan 100mg  Supplements/vitamins:  Iaso Tea, Chaga, Gano, Omega 3, B6 and B12 and other supplements.    Antidepressant:  amitriptyline 75mg   In February, his mother passed away.  However, he denies depression and anxiety at this time.     HISTORY: Onset:  62 years old Location:  Left greater than right sided Quality:  Pressure, throbbing Initial intensity:  2-10/10 (usually 6-7/10) Aura:  no Prodrome:  yawning Associated symptoms:  Nausea, dizziness, phonophobia, sometimes sees spots, photophobia.  No unilateral numbness or weakness. Initial Duration:  2 days (however this current headache has been going on for almost a week) Initial Frequency:  8-10 days per month Triggers/exacerbating factors:  Smell of burning rope Relieving factors:  none Activity:  Needs to lay down 4-5 days per month   Past abortive therapy:  Tylenol, ibuprofen, Benadryl, naproxen, Excedrin Migraine (helped), Fioricet Past preventative therapy:  topiramate 100mg  (stopped due to concern of potential side effects)   MRI of the brain with and without contrast was performed on 02/04/14, which showed non-specific white matter signal changes but nothing acute.  PAST MEDICAL HISTORY: Past Medical History:  Diagnosis Date  . Allergic rhinitis   . Asthma   . Chicken pox    as a child  . Migraines   . Umbilical hernia     MEDICATIONS: Current Outpatient Medications on File Prior to  Visit  Medication Sig Dispense Refill  . albuterol (PROVENTIL HFA;VENTOLIN HFA) 108 (90 Base) MCG/ACT inhaler Inhale 2 puffs into the lungs every 6 (six) hours as needed for wheezing or shortness of breath. 1 Inhaler 1  . Albuterol Sulfate (PROAIR RESPICLICK) 108 (90 Base) MCG/ACT AEPB Inhale 2 puffs into the lungs every 6 (six) hours as needed. 1 each 0  . aspirin-acetaminophen-caffeine (EXCEDRIN MIGRAINE) 250-250-65 MG per tablet Take 2 tablets by mouth daily as needed for headache.    . beclomethasone (QVAR) 40 MCG/ACT inhaler Inhale 1-2 puffs into the lungs daily. Or as directed 1 Inhaler 11  . cetirizine (ZYRTEC) 10 MG tablet Take 10 mg by mouth daily.    . Cholecalciferol (VITAMIN D3) 1000 UNITS CAPS Take 1 capsule by mouth daily.    . Flaxseed MISC by Does not apply route. Whole ground    . Omega-3 Fatty Acids (OMEGA 3 PO) Take 1 capsule by mouth 2 (two) times daily.    Marland Kitchen. omeprazole (PRILOSEC) 20 MG capsule Take 1 capsule (20 mg total) by mouth daily. Or as directed 30 capsule 5  . OVER THE COUNTER MEDICATION chaga 100 % pure    . OVER THE COUNTER MEDICATION NRG:  B6-B12 ONCE DAILY    . OVER THE COUNTER MEDICATION [place]    . OVER THE COUNTER MEDICATION GANO 100% PURE EXTRACT    . Probiotic Product (PROBIOTIC PO) Take 1 capsule by mouth daily. Green Vibrance    . QVAR REDIHALER 40 MCG/ACT inhaler INHALE 1 TO 2 PUFFS INTO THE LUNGS DAILY OR AS DIRECTED 10.6 g  5  . SUMAtriptan (IMITREX) 100 MG tablet Take 1 tablet at earliest onset of headache.  May repeat once in 2 hours if headache persists or recurs.  Do not exceed 2 tablets in 24hrs 10 tablet 5  . triamcinolone (NASACORT ALLERGY 24HR) 55 MCG/ACT AERO nasal inhaler Place 2 sprays into the nose daily.    . TURMERIC PO Take 1 tablet by mouth 3 (three) times daily.    . WHEY PROTEIN PO Take 60 g by mouth.     No current facility-administered medications on file prior to visit.     ALLERGIES: No Known Allergies  FAMILY  HISTORY: Family History  Problem Relation Age of Onset  . Diabetes Mellitus II Mother   . Thyroid disease Unknown   . Hypertension Unknown   . Thyroid disease Unknown     SOCIAL HISTORY: Social History   Socioeconomic History  . Marital status: Single    Spouse name: Not on file  . Number of children: Not on file  . Years of education: Not on file  . Highest education level: Not on file  Occupational History  . Not on file  Social Needs  . Financial resource strain: Not on file  . Food insecurity:    Worry: Not on file    Inability: Not on file  . Transportation needs:    Medical: Not on file    Non-medical: Not on file  Tobacco Use  . Smoking status: Never Smoker  . Smokeless tobacco: Never Used  Substance and Sexual Activity  . Alcohol use: No    Alcohol/week: 0.0 oz  . Drug use: No  . Sexual activity: Never  Lifestyle  . Physical activity:    Days per week: Not on file    Minutes per session: Not on file  . Stress: Not on file  Relationships  . Social connections:    Talks on phone: Not on file    Gets together: Not on file    Attends religious service: Not on file    Active member of club or organization: Not on file    Attends meetings of clubs or organizations: Not on file    Relationship status: Not on file  . Intimate partner violence:    Fear of current or ex partner: Not on file    Emotionally abused: Not on file    Physically abused: Not on file    Forced sexual activity: Not on file  Other Topics Concern  . 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    No etoh and ocass caffine           REVIEW OF SYSTEMS: Constitutional: No fevers, chills, or sweats, no generalized fatigue, change in appetite Eyes: No visual changes, double vision, eye pain Ear, nose and throat: No hearing loss, ear pain, nasal congestion, sore  throat Cardiovascular: No chest pain, palpitations Respiratory:  No shortness of breath at rest or with exertion, wheezes GastrointestinaI: No nausea, vomiting, diarrhea, abdominal pain, fecal incontinence Genitourinary:  No dysuria, urinary retention or frequency Musculoskeletal:  No neck pain, back pain Integumentary: No rash, pruritus, skin lesions Neurological: as above Psychiatric: No depression, insomnia, anxiety Endocrine: No palpitations, fatigue, diaphoresis, mood swings, change in appetite, change in weight, increased thirst Hematologic/Lymphatic:  No purpura, petechiae. Allergic/Immunologic: no itchy/runny eyes, nasal  congestion, recent allergic reactions, rashes  PHYSICAL EXAM: Vitals:   06/19/17 1048  BP: 116/72  Pulse: 95  SpO2: 95%   General: No acute distress.  Patient appears well-groomed.   Head:  Normocephalic/atraumatic Eyes:  Fundi examined but not visualized Neck: supple, no paraspinal tenderness, full range of motion Heart:  Regular rate and rhythm Lungs:  Clear to auscultation bilaterally Back: No paraspinal tenderness Neurological Exam: alert and oriented to person, place, and time. Attention span and concentration intact, recent and remote memory intact, fund of knowledge intact.  Speech fluent and not dysarthric, language intact.  CN II-XII intact. Bulk and tone normal, muscle strength 5/5 throughout.  Sensation to light touch  intact.  Deep tendon reflexes 2+ throughout.  Finger to nose testing intact.  Gait normal, Romberg negative.  IMPRESSION: Chronic migraine without aura, not intractable  PLAN: 1.  Increase amitriptyline to 100mg  at bedtime.  If ineffective, we will try extended release topiramate (immediate release caused cognitive changes that may not be experienced with extended release). 2.  Sumatriptan, advised to take earliest onset of migraine 3.  Limit pain relievers to no more than 2 days out of week 4.  Headache diary 5.  Follow up in 3  months.  Shon Millet, DO  CC: Dr. Fabian Sharp

## 2017-06-19 NOTE — Patient Instructions (Signed)
Migraine Recommendations: 1.  Increase amitriptyline 25mg  to four (100mg ) pills at bedtime.  Call in 4 weeks with update and we can adjust dose if needed. 2.  Take sumatriptan at earliest onset of headache.  May repeat dose once in 2 hours if needed.  Do not exceed two tablets in 24 hours. 3.  Limit use of pain relievers to no more than 2 days out of the week.  These medications include acetaminophen, ibuprofen, triptans and narcotics.  This will help reduce risk of rebound headaches. 4.  Be aware of common food triggers such as processed sweets, processed foods with nitrites (such as deli meat, hot dogs, sausages), foods with MSG, alcohol (such as wine), chocolate, certain cheeses, certain fruits (dried fruits, bananas, some citrus fruit), vinegar, diet soda. 4.  Avoid caffeine 5.  Routine exercise 6.  Proper sleep hygiene 7.  Stay adequately hydrated with water 8.  Keep a headache diary. 9.  Maintain proper stress management. 10.  Do not skip meals. 11.  Consider supplements:  Magnesium citrate 400mg  to 600mg  daily, riboflavin 400mg , Coenzyme Q 10 100mg  three times daily

## 2017-07-10 ENCOUNTER — Encounter: Payer: Self-pay | Admitting: Neurology

## 2017-09-09 ENCOUNTER — Encounter: Payer: Self-pay | Admitting: Neurology

## 2017-09-09 ENCOUNTER — Other Ambulatory Visit: Payer: Self-pay | Admitting: Neurology

## 2017-09-09 NOTE — Telephone Encounter (Signed)
Called and spoke with Pt. I advised him Dr Everlena CooperJaffe is out of the office, however, when he returns I will discuss it with him and call back.  Pt states he has been extremely tired in the mornings.

## 2017-09-09 NOTE — Telephone Encounter (Signed)
Patient is having migraine just about everyday and the medication he states that it is not helping. He is taking the Amitriptyline medication and he would like to maybe try the injections  Please call

## 2017-09-10 ENCOUNTER — Telehealth: Payer: Self-pay

## 2017-09-10 NOTE — Telephone Encounter (Signed)
Called and spoke with Pt, he will come tomorrow around 10:30a for cocktail

## 2017-09-10 NOTE — Telephone Encounter (Signed)
If he can come in for a migraine cocktail, it may help.

## 2017-09-10 NOTE — Telephone Encounter (Signed)
Called Pt about my chart message I received after talking with him yesterday.  He states he is light headed in the mornings, he has had 3-4 day headache, has used sumatriptan 3 times. He has tried to use Excedrin, but it makes him vomit. He has not taken the amitriptyline for close to a week now. He says it makes him "incapacitated" and unable to work.  I asked the Pt why he didn't explain all of this to me yesterday, and he said he was hoping it would get better, but it has not. He feels miserable and has taken all the sumatriptan he is supposed to take this week. Pt wants to know what else he can do at this time.

## 2017-09-11 ENCOUNTER — Ambulatory Visit (INDEPENDENT_AMBULATORY_CARE_PROVIDER_SITE_OTHER): Payer: BC Managed Care – PPO

## 2017-09-11 DIAGNOSIS — G43009 Migraine without aura, not intractable, without status migrainosus: Secondary | ICD-10-CM | POA: Diagnosis not present

## 2017-09-11 MED ORDER — KETOROLAC TROMETHAMINE 60 MG/2ML IM SOLN
60.0000 mg | Freq: Once | INTRAMUSCULAR | Status: AC
  Administered 2017-09-11: 60 mg via INTRAMUSCULAR

## 2017-09-15 MED ORDER — ATENOLOL 50 MG PO TABS: 50.0000 mg | ORAL_TABLET | Freq: Every day | ORAL | 3 refills | Status: DC

## 2017-09-15 NOTE — Telephone Encounter (Signed)
Called and advised Pt of atenolol. Pt is concerned about impotence.  He has already D/C amitriptyline.

## 2017-09-15 NOTE — Telephone Encounter (Signed)
He may start atenolol 50mg  daily.  It is originally a blood pressure medication, so he should monitor for lightheadedness.  We can increase dose in 4 weeks if needed.  He taper off amitriptyline back to 50mg  at bedtime for a week, then stop.

## 2017-09-15 NOTE — Telephone Encounter (Signed)
LMOVM for Pt to rtrn my call 

## 2017-09-15 NOTE — Addendum Note (Signed)
Addended by: Dorthy CoolerBURNS, SANDRA J on: 09/15/2017 04:01 PM   Modules accepted: Orders

## 2017-09-29 ENCOUNTER — Ambulatory Visit: Payer: BC Managed Care – PPO | Admitting: Neurology

## 2017-10-10 ENCOUNTER — Encounter: Payer: Self-pay | Admitting: Neurology

## 2017-10-10 ENCOUNTER — Ambulatory Visit: Payer: BC Managed Care – PPO | Admitting: Neurology

## 2017-10-10 VITALS — BP 130/76 | HR 96 | Ht 62.75 in | Wt 169.0 lb

## 2017-10-10 DIAGNOSIS — G43009 Migraine without aura, not intractable, without status migrainosus: Secondary | ICD-10-CM

## 2017-10-10 MED ORDER — ERENUMAB-AOOE 70 MG/ML ~~LOC~~ SOAJ
70.0000 mg | Freq: Once | SUBCUTANEOUS | 0 refills | Status: AC
Start: 2017-10-10 — End: 2017-10-10

## 2017-10-10 MED ORDER — ERENUMAB-AOOE 70 MG/ML ~~LOC~~ SOAJ: 70.0000 mg | SUBCUTANEOUS | 11 refills | Status: DC

## 2017-10-10 NOTE — Progress Notes (Signed)
NEUROLOGY FOLLOW UP OFFICE NOTE  [name] [id number]  HISTORY OF PRESENT ILLNESS: [name] is a 62 year old left-handed man with history of migraines and asthma who follows up for migraine.   UPDATE: In April, amitriptyline was increased from 75mg  to 100mg .  He discontinued it because it made him "incapacitated".  He has stopped Excedrin.  He says he made changes in his lifestyle that has helped.  He started taking resveratrol, which has made a difference. Intensity:  Mild, severe Duration:  Mild: 2 hours; Severe:  All day Frequency:  4 over past 2 1/2 weeks (1 severe, 3 mild) Abortive therapy: sumatriptan 100mg  (has not used it) Supplements/vitamins:  Iaso Tea, Chaga, Gano, Omega 3, B6 and B12, resveratrol and other supplements.    Antidepressant:  no  Depression/anxiety:  No     HISTORY: Onset:  62 years old Location:  Left greater than right sided Quality:  Pressure, throbbing Initial intensity:  2-10/10 (usually 6-7/10) Aura:  no Prodrome:  yawning Associated symptoms:  Nausea, dizziness, phonophobia, sometimes sees spots, photophobia.  No unilateral numbness or weakness. Initial Duration:  2 days (however this current headache has been going on for almost a week) Initial Frequency:  8-10 days per month Triggers/exacerbating factors:  Smell of burning rope Relieving factors:  none Activity:  Needs to lay down 4-5 days per month   Past abortive therapy:  Tylenol, ibuprofen, Benadryl, naproxen, Excedrin Migraine (helped), Fioricet Past preventative therapy:  topiramate 100mg  (stopped due to concern of potential side effects).  He does not want to take beta blockers due to potential side effects (impotence)   MRI of the brain with and without contrast was performed on 02/04/14, which showed non-specific white matter signal changes but nothing acute.  PAST MEDICAL HISTORY: Past Medical History:  Diagnosis Date  . Allergic rhinitis   . Asthma   . Chicken  pox    as a child  . Migraines   . Umbilical hernia     MEDICATIONS: Current Outpatient Medications on File Prior to Visit  Medication Sig Dispense Refill  . albuterol (PROVENTIL HFA;VENTOLIN HFA) 108 (90 Base) MCG/ACT inhaler Inhale 2 puffs into the lungs every 6 (six) hours as needed for wheezing or shortness of breath. 1 Inhaler 1  . Albuterol Sulfate (PROAIR RESPICLICK) 108 (90 Base) MCG/ACT AEPB Inhale 2 puffs into the lungs every 6 (six) hours as needed. 1 each 0  . aspirin-acetaminophen-caffeine (EXCEDRIN MIGRAINE) 250-250-65 MG per tablet Take 2 tablets by mouth daily as needed for headache.    Marland Kitchen. atenolol (TENORMIN) 50 MG tablet Take 1 tablet (50 mg total) by mouth daily. 30 tablet 3  . beclomethasone (QVAR) 40 MCG/ACT inhaler Inhale 1-2 puffs into the lungs daily. Or as directed 1 Inhaler 11  . cetirizine (ZYRTEC) 10 MG tablet Take 10 mg by mouth daily.    . Cholecalciferol (VITAMIN D3) 1000 UNITS CAPS Take 1 capsule by mouth daily.    . Flaxseed MISC by Does not apply route. Whole ground    . Omega-3 Fatty Acids (OMEGA 3 PO) Take 1 capsule by mouth 2 (two) times daily.    Marland Kitchen. omeprazole (PRILOSEC) 20 MG capsule Take 1 capsule (20 mg total) by mouth daily. Or as directed 30 capsule 5  . OVER THE COUNTER MEDICATION chaga 100 % pure    . OVER THE COUNTER MEDICATION NRG:  B6-B12 ONCE DAILY    . OVER THE COUNTER MEDICATION [place]    . OVER THE  COUNTER MEDICATION GANO 100% PURE EXTRACT    . Probiotic Product (PROBIOTIC PO) Take 1 capsule by mouth daily. Green Vibrance    . QVAR REDIHALER 40 MCG/ACT inhaler INHALE 1 TO 2 PUFFS INTO THE LUNGS DAILY OR AS DIRECTED 10.6 g 5  . SUMAtriptan (IMITREX) 100 MG tablet Take 1 tablet at earliest onset of headache.  May repeat once in 2 hours if headache persists or recurs.  Do not exceed 2 tablets in 24hrs 10 tablet 5  . triamcinolone (NASACORT ALLERGY 24HR) 55 MCG/ACT AERO nasal inhaler Place 2 sprays into the nose daily.    . TURMERIC PO Take 1  tablet by mouth 3 (three) times daily.    . WHEY PROTEIN PO Take 60 g by mouth.     No current facility-administered medications on file prior to visit.     ALLERGIES: No Known Allergies  FAMILY HISTORY: Family History  Problem Relation Age of Onset  . Diabetes Mellitus II Mother   . Thyroid disease Unknown   . Hypertension Unknown   . Thyroid disease Unknown     SOCIAL HISTORY: Social History   Socioeconomic History  . Marital status: Single    Spouse name: Not on file  . Number of children: Not on file  . Years of education: Not on file  . Highest education level: Not on file  Occupational History  . Not on file  Social Needs  . Financial resource strain: Not on file  . Food insecurity:    Worry: Not on file    Inability: Not on file  . Transportation needs:    Medical: Not on file    Non-medical: Not on file  Tobacco Use  . Smoking status: Never Smoker  . Smokeless tobacco: Never Used  Substance and Sexual Activity  . Alcohol use: No    Alcohol/week: 0.0 oz  . Drug use: No  . Sexual activity: Never  Lifestyle  . Physical activity:    Days per week: Not on file    Minutes per session: Not on file  . Stress: Not on file  Relationships  . Social connections:    Talks on phone: Not on file    Gets together: Not on file    Attends religious service: Not on file    Active member of club or organization: Not on file    Attends meetings of clubs or organizations: Not on file    Relationship status: Not on file  . Intimate partner violence:    Fear of current or ex partner: Not on file    Emotionally abused: Not on file    Physically abused: Not on file    Forced sexual activity: Not on file  Other Topics Concern  . 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    No etoh and ocass caffine           REVIEW OF SYSTEMS: Constitutional: No fevers,  chills, or sweats, no generalized fatigue, change in appetite Eyes: No visual changes, double vision, eye pain Ear, nose and throat: No hearing loss, ear pain, nasal congestion, sore throat Cardiovascular: No chest pain, palpitations Respiratory:  No shortness of breath at rest or with exertion, wheezes GastrointestinaI: No nausea, vomiting, diarrhea, abdominal pain, fecal incontinence Genitourinary:  No dysuria, urinary retention or frequency Musculoskeletal:  No neck pain, back pain Integumentary: No rash, pruritus, skin lesions Neurological: as above Psychiatric: No depression, insomnia, anxiety Endocrine: No palpitations, fatigue, diaphoresis, mood swings, change in appetite, change in weight, increased thirst Hematologic/Lymphatic:  No purpura, petechiae. Allergic/Immunologic: no itchy/runny eyes, nasal congestion, recent allergic reactions, rashes  PHYSICAL EXAM: Vitals:   10/10/17 1028 10/10/17 1030  BP: 130/76 130/76  Pulse:  96  SpO2:  97%   General: No acute distress.  Patient appears well-groomed.   Head:  Normocephalic/atraumatic Eyes:  Fundi examined but not visualized Neck: supple, no paraspinal tenderness, full range of motion Heart:  Regular rate and rhythm Lungs:  Clear to auscultation bilaterally Back: No paraspinal tenderness Neurological Exam: alert and oriented to person, place, and time. Attention span and concentration intact, recent and remote memory intact, fund of knowledge intact.  Speech fluent and not dysarthric, language intact.  CN II-XII intact. Bulk and tone normal, muscle strength 5/5 throughout.  Sensation to light touch, temperature and vibration intact.  Deep tendon reflexes 2+ throughout, toes downgoing.  Finger to nose and heel to shin testing intact.  Gait normal, Romberg negative.  IMPRESSION: Migraine without aura, without status migrainosus, not intractable  PLAN: 1.  We will start Aimovig 70mg  injection monthly (first dose here in  office) 2.  Limit use of pain relievers to no more than 2 days out of week to prevent rebound headache 3.  Keep headache diary 4.  Follow up in 4 months.  Shon Millet, DO  CC: Berniece Andreas, MD

## 2017-10-10 NOTE — Patient Instructions (Addendum)
1.  We will start Aimovig 70mg  injection monthly 2.  Limit use of pain relievers to no more than 2 days out of week to prevent rebound headache. 3.  Keep headache diary 4.  Follow up in 4 months.

## 2018-02-02 ENCOUNTER — Telehealth: Payer: Self-pay

## 2018-02-02 DIAGNOSIS — G43009 Migraine without aura, not intractable, without status migrainosus: Secondary | ICD-10-CM

## 2018-02-02 NOTE — Progress Notes (Signed)
Prior Authorization initiated via [email or web address] for pt's   Aimovig 70MG/ML auto-injectors  

## 2018-02-02 NOTE — Telephone Encounter (Signed)
Ask him if he prefers another autoinjecter or syringe.

## 2018-02-02 NOTE — Progress Notes (Signed)
Via [email or web address] - preferred medications: Ajovy or Emgality

## 2018-02-02 NOTE — Telephone Encounter (Signed)
PA initiated for Aimovig.  Insurance preferred medications are Ajovy or Emgality pls send new Rx

## 2018-02-03 MED ORDER — GALCANEZUMAB-GNLM 120 MG/ML ~~LOC~~ SOSY: 120.0000 mg | PREFILLED_SYRINGE | SUBCUTANEOUS | 11 refills | Status: DC

## 2018-02-03 NOTE — Telephone Encounter (Signed)
Called and spoke with Pt. He has been using the copay car, however [facility] has been taking the copay card out and running it through [employer]. He would prefer to stay on Aimovig, but is frustrated with the trouble. He would like to stay on an auto-injector. I advised him the Emgality first dose will be 2 injections, then one injection monthly therafter. [facility], spoke with [employer] Corporationob

## 2018-03-03 ENCOUNTER — Ambulatory Visit: Payer: BC Managed Care – PPO | Admitting: Neurology

## 2018-03-17 ENCOUNTER — Encounter: Payer: BC Managed Care – PPO | Admitting: Internal Medicine

## 2018-03-23 NOTE — Progress Notes (Signed)
Chief Complaint  Patient presents with  . Annual Exam    pt is concerned about his asthma today due to weather and believes he has nerve damage in his right knee    HPI: Patient  [name]  63 y.o. comes in today for Preventive Health Care visit   HAs :    Under neuro care . Med issues  Asthma problematic some with weather change in fall winter taking q var and as needed albut  None today .  Has low weight with exercise  Working on iot  bp has been ok.   Health Maintenance  Topic Date Due  . COLONOSCOPY  06/10/2007  . INFLUENZA VACCINE  10/16/2017  . TETANUS/TDAP  09/07/2019  . Hepatitis C Screening  Completed  . HIV Screening  Completed   Health Maintenance Review LIFESTYLE:  Exercise:   Training  6 days per week  Tobacco/ETS:  no Alcohol:   no Sugar beverages: tea ocass  Sleep: about 5.5-6 hours  Drug use: no HH of   1 no pets  Work: long days   Aver 55-65    ROS:  No sig gu sx  GEN/ HEENT: No fever, significant weight changes sweats headaches vision problems hearing changes, CV/ PULM; No chest pain shortness of breath cough, syncope,edema  change in exercise tolerance. GI /GU: No adominal pain, vomiting, change in bowel habits. No blood in the stool. No significant GU symptoms. SKIN/HEME: ,no acute skin rashes suspicious lesions or bleeding. No lymphadenopathy, nodules, masses.  NEURO/ PSYCH:  No neurologic signs such as weakness numbness. No depression anxiety. IMM/ Allergy: No unusual infections.  Allergy .   REST of 12 system review negative except as per HPI   Past Medical History:  Diagnosis Date  . Allergic rhinitis   . Asthma   . Chicken pox    as a child  . Migraines   . Umbilical hernia     Past Surgical History:  Procedure Laterality Date  . ARTHROSCOPIC REPAIR ACL  1991  . NASAL SINUS SURGERY  1999    Family History  Problem Relation Age of Onset  . Diabetes Mellitus II Mother   . Thyroid disease Unknown   . Hypertension Unknown    . Thyroid disease Unknown     Social History   Socioeconomic History  . Marital status: Single    Spouse name: Not on file  . Number of children: Not on file  . Years of education: Not on file  . Highest education level: Not on file  Occupational History  . Not on file  Social Needs  . Financial resource strain: Not on file  . Food insecurity:    Worry: Not on file    Inability: Not on file  . Transportation needs:    Medical: Not on file    Non-medical: Not on file  Tobacco Use  . Smoking status: Never Smoker  . Smokeless tobacco: Never Used  Substance and Sexual Activity  . Alcohol use: No    Alcohol/week: 0.0 standard drinks  . Drug use: No  . Sexual activity: Never  Lifestyle  . Physical activity:    Days per week: Not on file    Minutes per session: Not on file  . Stress: Not on file  Relationships  . Social connections:    Talks on phone: Not on file    Gets together: Not on file    Attends religious service: Not on file  Active member of club or organization: Not on file    Attends meetings of clubs or organizations: Not on file    Relationship status: Not on file  Other Topics Concern  . 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 albuterol (PROVENTIL HFA;VENTOLIN HFA) 108 (90 Base) MCG/ACT inhaler Inhale 2 puffs into the lungs every 6 (six) hours as needed for wheezing or shortness of breath. 1 Inhaler 1  . Albuterol Sulfate (PROAIR RESPICLICK) 161 (90 Base) MCG/ACT AEPB Inhale 2 puffs into the lungs every 6 (six) hours as needed. 1 each 0  . aspirin-acetaminophen-caffeine (EXCEDRIN MIGRAINE) 096-045-40 MG per tablet Take 2 tablets by mouth daily as needed for headache.    Marland Kitchen atenolol (TENORMIN) 50 MG tablet Take 1 tablet (50 mg  total) by mouth daily. 30 tablet 3  . beclomethasone (QVAR) 40 MCG/ACT inhaler Inhale 1-2 puffs into the lungs daily. Or as directed 1 Inhaler 11  . cetirizine (ZYRTEC) 10 MG tablet Take 10 mg by mouth daily.    . Cholecalciferol (VITAMIN D3) 1000 UNITS CAPS Take 1 capsule by mouth daily.    Eduard Roux (AIMOVIG) 70 MG/ML SOAJ Inject 70 mg into the skin every 30 (thirty) days. 1 pen 11  . Flaxseed MISC by Does not apply route. Whole ground    . Galcanezumab-gnlm (EMGALITY) 120 MG/ML SOSY Inject 120 mg into the skin every 30 (thirty) days. 1 Syringe 11  . Omega-3 Fatty Acids (OMEGA 3 PO) Take 1 capsule by mouth 2 (two) times daily.    Marland Kitchen omeprazole (PRILOSEC) 20 MG capsule Take 1 capsule (20 mg total) by mouth daily. Or as directed 30 capsule 5  . OVER THE COUNTER MEDICATION chaga 100 % pure    . OVER THE COUNTER MEDICATION NRG:  B6-B12 ONCE DAILY    . OVER THE COUNTER MEDICATION [place]    . OVER THE COUNTER MEDICATION GANO 100% PURE EXTRACT    . Probiotic Product (PROBIOTIC PO) Take 1 capsule by mouth daily. Green Vibrance    . QVAR REDIHALER 40 MCG/ACT inhaler INHALE 1 TO 2 PUFFS INTO THE LUNGS DAILY OR AS DIRECTED 10.6 g 5  . SUMAtriptan (IMITREX) 100 MG tablet Take 1 tablet at earliest onset of headache.  May repeat once in 2 hours if headache persists or recurs.  Do not exceed 2 tablets in 24hrs 10 tablet 5  . triamcinolone (NASACORT ALLERGY 24HR) 55 MCG/ACT AERO nasal inhaler Place 2 sprays into the nose daily.    . TURMERIC PO Take 1 tablet by mouth 3 (three) times daily.    . WHEY PROTEIN PO Take 60 g by mouth.     No facility-administered medications prior to visit.      EXAM:  BP 130/84 (BP Location: Right Arm, Patient Position: Sitting, Cuff Size: Large)   Pulse 87   Temp 98.3 F (36.8 C) (Oral)   Ht '5\' 2"'  (1.575 m)   Wt 162 lb 14.4 oz (73.9 kg)   BMI 29.79 kg/m   Body mass index is 29.79 kg/m. Wt Readings from Last 3 Encounters:  03/24/18 162 lb 14.4 oz (73.9 kg)    10/10/17  169 lb (76.7 kg)  06/19/17 178 lb (80.7 kg)    Physical Exam: Vital signs reviewed HTD:SKAJ is a well-developed well-nourished alert cooperative    who appearsr stated age in no acute distress.  HEENT: normocephalic atraumatic , Eyes: PERRL EOM's full, conjunctiva clear, Nares: paten,t no deformity discharge or tenderness., Ears: no deformity EAC's clear TMs with normal landmarks. Mouth: clear OP, no lesions, edema.  Moist mucous membranes. Dentition in adequate repair. NECK: supple without masses, thyromegaly or bruits. CHEST/PULM:  Clear to auscultation and percussion breath sounds equal no wheeze , rales or rhonchi. No chest wall deformities or tenderness. CV: PMI is nondisplaced, S1 S2 no gallops, murmurs, rubs. Peripheral pulses are full without delay.No JVD .  ABDOMEN: Bowel sounds normal nontender  No guard or rebound, no hepato splenomegal no CVA tenderness.  No hernia. Extremtities:  No clubbing cyanosis or edema, no acute joint swelling or redness no focal atrophy NEURO:  Oriented x3, cranial nerves 3-12 appear to be intact, no obvious focal weakness,gait within normal limits no abnormal reflexes or asymmetrical SKIN: No acute rashes normal turgor, color, no bruising or petechiae. PSYCH: Oriented, good eye contact, no obvious depression anxiety, cognition and judgment appear normal. LN: no cervical axillary inguinal adenopathy Rectal prostate 1+ no nodules   Lab Results  Component Value Date   WBC 5.0 03/12/2017   HGB 15.1 03/12/2017   HCT 45.5 03/12/2017   PLT 239.0 03/12/2017   GLUCOSE 95 03/12/2017   CHOL 155 03/12/2017   TRIG 59.0 03/12/2017   HDL 61.70 03/12/2017   LDLCALC 81 03/12/2017   ALT 33 03/12/2017   AST 29 03/12/2017   NA 139 03/12/2017   K 4.1 03/12/2017   CL 102 03/12/2017   CREATININE 1.20 03/12/2017   BUN 10 03/12/2017   CO2 31 03/12/2017   TSH 0.83 02/14/2016   PSA 0.58 02/14/2016   HGBA1C 5.7 09/13/2013    BP Readings from Last 3  Encounters:  03/24/18 130/84  10/10/17 130/76  06/19/17 116/72    Shared Decision Making    ASSESSMENT AND PLAN:  Discussed the following assessment and plan:  Visit for preventive health examination - Plan: Basic metabolic panel, CBC with Differential/Platelet, Hepatic function panel, Lipid panel  Medication management - Plan: Basic metabolic panel, CBC with Differential/Platelet, Hepatic function panel, Lipid panel  Obesity (BMI 30-39.9) - Plan: Basic metabolic panel, CBC with Differential/Platelet, Hepatic function panel, Lipid panel  Screening for cardiovascular condition - Plan: Basic metabolic panel, CBC with Differential/Platelet, Hepatic function panel, Lipid panel  Mild asthma without complication, unspecified whether persistent - Plan: Basic metabolic panel, CBC with Differential/Platelet, Hepatic function panel, Lipid panel  Migraine without aura and without status migrainosus, not intractable - Plan: Basic metabolic panel, CBC with Differential/Platelet, Hepatic function panel, Lipid panel  Screening PSA (prostate specific antigen) - Plan: PSA  Colon cancer screening - Plan: Fecal occult blood, imunochemical Asthma   Partial control  Trial of  symbicort in stead of  q var for control  rov in 2-3 mos  Concern about esophagus sx  gerd although better since  Weigh loss   ? Colon screen   Last ifob was last year  And will need to be repeated   Overlooked  At visit today  Order placed and will plan for this to be done  Patient Care Team: Burnis Medin, MD as PCP - General Patient Instructions  Continue lifestyle intervention healthy eating and exercise . Can try a  Different controller  inhaler to see if better  For break through wheezing   Will notify you  of labs when available.  If asthma not controlled then plan rov    Or   cpx in a year .  Your exam is good today   bp goal is 120/80 range   rovin 2-3 mos  Check ashtma and gerd     Preventive Care 40-64 Years,  Male Preventive care refers to lifestyle choices and visits with your health care provider that can promote health and wellness. What does preventive care include?   A yearly physical exam. This is also called an annual well check.  Dental exams once or twice a year.  Routine eye exams. Ask your health care provider how often you should have your eyes checked.  Personal lifestyle choices, including: ? Daily care of your teeth and gums. ? Regular physical activity. ? Eating a healthy diet. ? Avoiding tobacco and drug use. ? Limiting alcohol use. ? Practicing safe sex. ? Taking low-dose aspirin every day starting at age 59. What happens during an annual well check? The services and screenings done by your health care provider during your annual well check will depend on your age, overall health, lifestyle risk factors, and family history of disease. Counseling Your health care provider may ask you questions about your:  Alcohol use.  Tobacco use.  Drug use.  Emotional well-being.  Home and relationship well-being.  Sexual activity.  Eating habits.  Work and work Statistician. Screening You may have the following tests or measurements:  Height, weight, and BMI.  Blood pressure.  Lipid and cholesterol levels. These may be checked every 5 years, or more frequently if you are over 79 years old.  Skin check.  Lung cancer screening. You may have this screening every year starting at age 89 if you have a 30-pack-year history of smoking and currently smoke or have quit within the past 15 years.  Colorectal cancer screening. All adults should have this screening starting at age 28 and continuing until age 41. Your health care provider may recommend screening at age 55. You will have tests every 1-10 years, depending on your results and the type of screening test. People at increased risk should start screening at an earlier age. Screening tests may include: ? Guaiac-based  fecal occult blood testing. ? Fecal immunochemical test (FIT). ? Stool DNA test. ? Virtual colonoscopy. ? Sigmoidoscopy. During this test, a flexible tube with a tiny camera (sigmoidoscope) is used to examine your rectum and lower colon. The sigmoidoscope is inserted through your anus into your rectum and lower colon. ? Colonoscopy. During this test, a long, thin, flexible tube with a tiny camera (colonoscope) is used to examine your entire colon and rectum.  Prostate cancer screening. Recommendations will vary depending on your family history and other risks.  Hepatitis C blood test.  Hepatitis B blood test.  Sexually transmitted disease (STD) testing.  Diabetes screening. This is done by checking your blood sugar (glucose) after you have not eaten for a while (fasting). You may have this done every 1-3 years. Discuss your test results, treatment options, and if necessary, the need for more tests with your health care provider. Vaccines Your health care provider may recommend certain vaccines, such as:  Influenza vaccine. This is recommended every year.  Tetanus, diphtheria, and acellular pertussis (Tdap, Td) vaccine. You may need a Td booster every 10 years.  Varicella vaccine. You may need this if you have not been  vaccinated.  Zoster vaccine. You may need this after age 29.  Measles, mumps, and rubella (MMR) vaccine. You may need at least one dose of MMR if you were born in 1957 or later. You may also need a second dose.  Pneumococcal 13-valent conjugate (PCV13) vaccine. You may need this if you have certain conditions and have not been vaccinated.  Pneumococcal polysaccharide (PPSV23) vaccine. You may need one or two doses if you smoke cigarettes or if you have certain conditions.  Meningococcal vaccine. You may need this if you have certain conditions.  Hepatitis A vaccine. You may need this if you have certain conditions or if you travel or work in places where you may be  exposed to hepatitis A.  Hepatitis B vaccine. You may need this if you have certain conditions or if you travel or work in places where you may be exposed to hepatitis B.  Haemophilus influenzae type b (Hib) vaccine. You may need this if you have certain risk factors. Talk to your health care provider about which screenings and vaccines you need and how often you need them. This information is not intended to replace advice given to you by your health care provider. Make sure you discuss any questions you have with your health care provider. Document Released: 03/31/2015 Document Revised: 04/24/2017 Document Reviewed: 01/03/2015 Elsevier Interactive Patient Education  2019 [place] K. Storm Sovine M.D.

## 2018-03-24 ENCOUNTER — Encounter: Payer: Self-pay | Admitting: Internal Medicine

## 2018-03-24 ENCOUNTER — Ambulatory Visit (INDEPENDENT_AMBULATORY_CARE_PROVIDER_SITE_OTHER): Payer: BC Managed Care – PPO | Admitting: Internal Medicine

## 2018-03-24 VITALS — BP 130/84 | HR 87 | Temp 98.3°F | Ht 62.0 in | Wt 162.9 lb

## 2018-03-24 DIAGNOSIS — Z1211 Encounter for screening for malignant neoplasm of colon: Secondary | ICD-10-CM

## 2018-03-24 DIAGNOSIS — Z0000 Encounter for general adult medical examination without abnormal findings: Secondary | ICD-10-CM | POA: Diagnosis not present

## 2018-03-24 DIAGNOSIS — Z79899 Other long term (current) drug therapy: Secondary | ICD-10-CM

## 2018-03-24 DIAGNOSIS — Z125 Encounter for screening for malignant neoplasm of prostate: Secondary | ICD-10-CM | POA: Diagnosis not present

## 2018-03-24 DIAGNOSIS — G43009 Migraine without aura, not intractable, without status migrainosus: Secondary | ICD-10-CM

## 2018-03-24 DIAGNOSIS — J45909 Unspecified asthma, uncomplicated: Secondary | ICD-10-CM | POA: Diagnosis not present

## 2018-03-24 DIAGNOSIS — E669 Obesity, unspecified: Secondary | ICD-10-CM

## 2018-03-24 DIAGNOSIS — Z136 Encounter for screening for cardiovascular disorders: Secondary | ICD-10-CM | POA: Diagnosis not present

## 2018-03-24 LAB — HEPATIC FUNCTION PANEL
ALBUMIN: 4.6 g/dL (ref 3.5–5.2)
ALK PHOS: 59 U/L (ref 39–117)
ALT: 30 U/L (ref 0–53)
AST: 29 U/L (ref 0–37)
BILIRUBIN DIRECT: 0.1 mg/dL (ref 0.0–0.3)
TOTAL PROTEIN: 6.7 g/dL (ref 6.0–8.3)
Total Bilirubin: 0.7 mg/dL (ref 0.2–1.2)

## 2018-03-24 LAB — LIPID PANEL
CHOL/HDL RATIO: 3
Cholesterol: 163 mg/dL (ref 0–200)
HDL: 61.5 mg/dL (ref >39.00)
LDL Cholesterol: 86 mg/dL (ref 0–99)
NONHDL: 101.51
Triglycerides: 78 mg/dL (ref 0.0–149.0)
VLDL: 15.6 mg/dL (ref 0.0–40.0)

## 2018-03-24 LAB — CBC WITH DIFFERENTIAL/PLATELET
BASOS PCT: 0.5 % (ref 0.0–3.0)
Basophils Absolute: 0 10*3/uL (ref 0.0–0.1)
EOS ABS: 0.1 10*3/uL (ref 0.0–0.7)
Eosinophils Relative: 1.8 % (ref 0.0–5.0)
HEMATOCRIT: 45.3 % (ref 39.0–52.0)
HEMOGLOBIN: 15.2 g/dL (ref 13.0–17.0)
LYMPHS PCT: 34.1 % (ref 12.0–46.0)
Lymphs Abs: 1.6 10*3/uL (ref 0.7–4.0)
MCHC: 33.6 g/dL (ref 30.0–36.0)
MCV: 98.6 fl (ref 78.0–100.0)
MONOS PCT: 8.1 % (ref 3.0–12.0)
Monocytes Absolute: 0.4 10*3/uL (ref 0.1–1.0)
NEUTROS ABS: 2.7 10*3/uL (ref 1.4–7.7)
Neutrophils Relative %: 55.5 % (ref 43.0–77.0)
PLATELETS: 217 10*3/uL (ref 150.0–400.0)
RBC: 4.59 Mil/uL (ref 4.22–5.81)
RDW: 13.6 % (ref 11.5–15.5)
WBC: 4.8 10*3/uL (ref 4.0–10.5)

## 2018-03-24 LAB — BASIC METABOLIC PANEL WITH GFR
BUN: 13 mg/dL (ref 6–23)
CO2: 32 meq/L (ref 19–32)
Calcium: 10.3 mg/dL (ref 8.4–10.5)
Chloride: 101 meq/L (ref 96–112)
Creatinine, Ser: 0.98 mg/dL (ref 0.40–1.50)
GFR: 100.07 mL/min
Glucose, Bld: 93 mg/dL (ref 70–99)
Potassium: 5 meq/L (ref 3.5–5.1)
Sodium: 139 meq/L (ref 135–145)

## 2018-03-24 LAB — PSA: PSA: 0.54 ng/mL (ref 0.10–4.00)

## 2018-03-24 MED ORDER — BUDESONIDE-FORMOTEROL FUMARATE 160-4.5 MCG/ACT IN AERO: 2.0000 | INHALATION_SPRAY | Freq: Two times a day (BID) | RESPIRATORY_TRACT | 6 refills | Status: DC

## 2018-03-24 NOTE — Patient Instructions (Addendum)
Continue lifestyle intervention healthy eating and exercise . Can try a  Different controller inhaler to see if better  For break through wheezing   Will notify you  of labs when available.  If asthma not controlled then plan rov    Or   cpx in a year .  Your exam is good today   bp goal is 120/80 range   rovin 2-3 mos  Check ashtma and gerd     Preventive Care 40-64 Years, Male Preventive care refers to lifestyle choices and visits with your health care provider that can promote health and wellness. What does preventive care include?   A yearly physical exam. This is also called an annual well check.  Dental exams once or twice a year.  Routine eye exams. Ask your health care provider how often you should have your eyes checked.  Personal lifestyle choices, including: ? Daily care of your teeth and gums. ? Regular physical activity. ? Eating a healthy diet. ? Avoiding tobacco and drug use. ? Limiting alcohol use. ? Practicing safe sex. ? Taking low-dose aspirin every day starting at age 55. What happens during an annual well check? The services and screenings done by your health care provider during your annual well check will depend on your age, overall health, lifestyle risk factors, and family history of disease. Counseling Your health care provider may ask you questions about your:  Alcohol use.  Tobacco use.  Drug use.  Emotional well-being.  Home and relationship well-being.  Sexual activity.  Eating habits.  Work and work Statistician. Screening You may have the following tests or measurements:  Height, weight, and BMI.  Blood pressure.  Lipid and cholesterol levels. These may be checked every 5 years, or more frequently if you are over 86 years old.  Skin check.  Lung cancer screening. You may have this screening every year starting at age 40 if you have a 30-pack-year history of smoking and currently smoke or have quit within the past 15  years.  Colorectal cancer screening. All adults should have this screening starting at age 5 and continuing until age 38. Your health care provider may recommend screening at age 4. You will have tests every 1-10 years, depending on your results and the type of screening test. People at increased risk should start screening at an earlier age. Screening tests may include: ? Guaiac-based fecal occult blood testing. ? Fecal immunochemical test (FIT). ? Stool DNA test. ? Virtual colonoscopy. ? Sigmoidoscopy. During this test, a flexible tube with a tiny camera (sigmoidoscope) is used to examine your rectum and lower colon. The sigmoidoscope is inserted through your anus into your rectum and lower colon. ? Colonoscopy. During this test, a long, thin, flexible tube with a tiny camera (colonoscope) is used to examine your entire colon and rectum.  Prostate cancer screening. Recommendations will vary depending on your family history and other risks.  Hepatitis C blood test.  Hepatitis B blood test.  Sexually transmitted disease (STD) testing.  Diabetes screening. This is done by checking your blood sugar (glucose) after you have not eaten for a while (fasting). You may have this done every 1-3 years. Discuss your test results, treatment options, and if necessary, the need for more tests with your health care provider. Vaccines Your health care provider may recommend certain vaccines, such as:  Influenza vaccine. This is recommended every year.  Tetanus, diphtheria, and acellular pertussis (Tdap, Td) vaccine. You may need a Td booster every 10  years.  Varicella vaccine. You may need this if you have not been vaccinated.  Zoster vaccine. You may need this after age 29.  Measles, mumps, and rubella (MMR) vaccine. You may need at least one dose of MMR if you were born in 1957 or later. You may also need a second dose.  Pneumococcal 13-valent conjugate (PCV13) vaccine. You may need this if you  have certain conditions and have not been vaccinated.  Pneumococcal polysaccharide (PPSV23) vaccine. You may need one or two doses if you smoke cigarettes or if you have certain conditions.  Meningococcal vaccine. You may need this if you have certain conditions.  Hepatitis A vaccine. You may need this if you have certain conditions or if you travel or work in places where you may be exposed to hepatitis A.  Hepatitis B vaccine. You may need this if you have certain conditions or if you travel or work in places where you may be exposed to hepatitis B.  Haemophilus influenzae type b (Hib) vaccine. You may need this if you have certain risk factors. Talk to your health care provider about which screenings and vaccines you need and how often you need them. This information is not intended to replace advice given to you by your health care provider. Make sure you discuss any questions you have with your health care provider. Document Released: 03/31/2015 Document Revised: 04/24/2017 Document Reviewed: 01/03/2015 Elsevier Interactive Patient Education  2019 [employer].

## 2018-04-21 NOTE — Progress Notes (Signed)
NEUROLOGY FOLLOW UP OFFICE NOTE  [name] [id number]  HISTORY OF PRESENT ILLNESS: [name] is a 63 year old left-handed man who follows up for migraine.  UPDATE: Last visit, we started him on Aimovig, however he switched to [employer] due to insurance preference.    Intensity:  Moderate to severe Duration:  1 to 3 hours Frequency:  One day a week (last week of month may be 2 or 3 in a week) Frequency of abortive medication: 2 to 3 days  Current NSAIDS:  none Current analgesics:  none Current triptans:  Sumatriptan 100mg  Current ergotamine:  none Current anti-emetic:  none Current muscle relaxants:  none Current anti-anxiolytic:  none Current sleep aide:  none Current Antihypertensive medications:  none Current Antidepressant medications:  none Current Anticonvulsant medications:  none Current anti-CGRP:  Emgality Current Vitamins/Herbal/Supplements:  Resveratrol, Iaso Tea, Chaga, G[place], Omega 3, B6, B12 Current Antihistamines/Decongestants:  Zyrtec Other therapy:  none  Depression/anxiety:  No Other pain:  No  HISTORY:  Onset: 63 years old Location: Left greater than right sided Quality: Pressure, throbbing Initial intensity: 2-10/10 (usually 6-7/10) Aura: no Prodrome: yawning Associated symptoms: Nausea, dizziness, phonophobia, sometimes sees spots, photophobia.  No unilateral numbness or weakness. Initial Duration: 2 days (however this current headache has been going on for almost a week) Initial Frequency: 8-10 days per month Triggers: Spell of burning rope Relieving factors: none Activity: Needs to lay down 4-5 days per month  MRI of the brain with and without contrast was performed on 02/04/14, which showed non-specific white matter signal changes but nothing acute.  Past NSAIDS: Ibuprofen, naproxen Past analgesics: Tylenol, Fioricet, Excedrin Migraine Past abortive triptans: None Past abortive ergotamine: None Past muscle relaxants:  None Past anti-emetic: None Past antihypertensive medications: atenolol Past antidepressant medications: Amitriptyline 100 mg (discontinued because it made him "incapacitated ") Past anticonvulsant medications: Topiramate 100 mg at bedtime (stopped due to concern of potential side effects) Past anti-CGRP: Aimovig (switched to [facility]Emgality because he had difficulty getting it at his pharmacy). Past vitamins/Herbal/Supplements: None Past antihistamines/decongestants: Benadryl Other past therapies: None  PAST MEDICAL HISTORY: Past Medical History:  Diagnosis Date  . Allergic rhinitis   . Asthma   . Chicken pox    as a child  . Migraines   . Umbilical hernia     MEDICATIONS: Current Outpatient Medications on File Prior to Visit  Medication Sig Dispense Refill  . albuterol (PROVENTIL HFA;VENTOLIN HFA) 108 (90 Base) MCG/ACT inhaler Inhale 2 puffs into the lungs every 6 (six) hours as needed for wheezing or shortness of breath. 1 Inhaler 1  . Albuterol Sulfate (PROAIR RESPICLICK) 108 (90 Base) MCG/ACT AEPB Inhale 2 puffs into the lungs every 6 (six) hours as needed. 1 each 0  . aspirin-acetaminophen-caffeine (EXCEDRIN MIGRAINE) 250-250-65 MG per tablet Take 2 tablets by mouth daily as needed for headache.    Marland Kitchen. atenolol (TENORMIN) 50 MG tablet Take 1 tablet (50 mg total) by mouth daily. 30 tablet 3  . beclomethasone (QVAR) 40 MCG/ACT inhaler Inhale 1-2 puffs into the lungs daily. Or as directed 1 Inhaler 11  . budesonide-formoterol (SYMBICORT) 160-4.5 MCG/ACT inhaler Inhale 2 puffs into the lungs 2 (two) times daily. 1 Inhaler 6  . cetirizine (ZYRTEC) 10 MG tablet Take 10 mg by mouth daily.    . Cholecalciferol (VITAMIN D3) 1000 UNITS CAPS Take 1 capsule by mouth daily.    Dorise Hiss. Erenumab-aooe (AIMOVIG) 70 MG/ML SOAJ Inject 70 mg into the skin every 30 (thirty) days. 1 pen 11  .  Flaxseed MISC by Does not apply route. Whole ground    . Galcanezumab-gnlm (EMGALITY) 120 MG/ML SOSY Inject 120 mg into  the skin every 30 (thirty) days. 1 Syringe 11  . Omega-3 Fatty Acids (OMEGA 3 PO) Take 1 capsule by mouth 2 (two) times daily.    Marland Kitchen omeprazole (PRILOSEC) 20 MG capsule Take 1 capsule (20 mg total) by mouth daily. Or as directed 30 capsule 5  . OVER THE COUNTER MEDICATION chaga 100 % pure    . OVER THE COUNTER MEDICATION NRG:  B6-B12 ONCE DAILY    . OVER THE COUNTER MEDICATION [place]a    . OVER THE COUNTER MEDICATION GANO 100% PURE EXTRACT    . Probiotic Product (PROBIOTIC PO) Take 1 capsule by mouth daily. Green Vibrance    . QVAR REDIHALER 40 MCG/ACT inhaler INHALE 1 TO 2 PUFFS INTO THE LUNGS DAILY OR AS DIRECTED 10.6 g 5  . SUMAtriptan (IMITREX) 100 MG tablet Take 1 tablet at earliest onset of headache.  May repeat once in 2 hours if headache persists or recurs.  Do not exceed 2 tablets in 24hrs 10 tablet 5  . triamcinolone (NASACORT ALLERGY 24HR) 55 MCG/ACT AERO nasal inhaler Place 2 sprays into the nose daily.    . TURMERIC PO Take 1 tablet by mouth 3 (three) times daily.    . WHEY PROTEIN PO Take 60 g by mouth.     No current facility-administered medications on file prior to visit.     ALLERGIES: No Known Allergies  FAMILY HISTORY: Family History  Problem Relation Age of Onset  . Diabetes Mellitus II Mother   . Thyroid disease Unknown   . Hypertension Unknown   . Thyroid disease Unknown    SOCIAL HISTORY: Social History   Socioeconomic History  . Marital status: Single    Spouse name: Not on file  . Number of children: Not on file  . Years of education: Not on file  . Highest education level: Not on file  Occupational History  . Not on file  Social Needs  . Financial resource strain: Not on file  . Food insecurity:    Worry: Not on file    Inability: Not on file  . Transportation needs:    Medical: Not on file    Non-medical: Not on file  Tobacco Use  . Smoking status: Never Smoker  . Smokeless tobacco: Never Used  Substance and Sexual Activity  . Alcohol use:  No    Alcohol/week: 0.0 standard drinks  . Drug use: No  . Sexual activity: Never  Lifestyle  . Physical activity:    Days per week: Not on file    Minutes per session: Not on file  . Stress: Not on file  Relationships  . Social connections:    Talks on phone: Not on file    Gets together: Not on file    Attends religious service: Not on file    Active member of club or organization: Not on file    Attends meetings of clubs or organizations: Not on file    Relationship status: Not on file  . Intimate partner violence:    Fear of current or ex partner: Not on file    Emotionally abused: Not on file    Physically abused: Not on file    Forced sexual activity: Not on file  Other Topics Concern  . 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   Not tobacco    No etoh and ocass caffine           REVIEW OF SYSTEMS: Constitutional: No fevers, chills, or sweats, no generalized fatigue, change in appetite Eyes: No visual changes, double vision, eye pain Ear, nose and throat: No hearing loss, ear pain, nasal congestion, sore throat Cardiovascular: No chest pain, palpitations Respiratory:  No shortness of breath at rest or with exertion, wheezes GastrointestinaI: No nausea, vomiting, diarrhea, abdominal pain, fecal incontinence Genitourinary:  No dysuria, urinary retention or frequency Musculoskeletal:  No neck pain, back pain Integumentary: No rash, pruritus, skin lesions Neurological: as above Psychiatric: No depression, insomnia, anxiety Endocrine: No palpitations, fatigue, diaphoresis, mood swings, change in appetite, change in weight, increased thirst Hematologic/Lymphatic:  No purpura, petechiae. Allergic/Immunologic: no itchy/runny eyes, nasal congestion, recent allergic reactions, rashes  PHYSICAL EXAM: Blood pressure 116/70, pulse 67, height 5\' 2"  (1.575 m), weight 158 lb (71.7  kg), SpO2 97 %. General: No acute distress.  Patient appears well-groomed. Head:  Normocephalic/atraumatic Eyes:  Fundi examined but not visualized Neck: supple, no paraspinal tenderness, full range of motion Heart:  Regular rate and rhythm Lungs:  Clear to auscultation bilaterally Back: No paraspinal tenderness Neurological Exam: alert and oriented to person, place, and time. Attention span and concentration intact, recent and remote memory intact, fund of knowledge intact.  Speech fluent and not dysarthric, language intact.  CN II-XII intact. Bulk and tone normal, muscle strength 5/5 throughout.  Sensation to light touch, temperature and vibration intact.  Deep tendon reflexes 2+ throughout, toes downgoing.  Finger to nose and heel to shin testing intact.  Gait normal, Romberg negative.  IMPRESSION: Migraine without aura, without status migrainosus, not intractable  PLAN: 1.  For preventative management, continue Emgality 2.  For abortive therapy, sumatriptan 100mg  3.  Limit use of pain relievers to no more than 2 days out of week to prevent risk of rebound or medication-overuse headache. 4.  Keep headache diary 5.  Exercise, hydration, caffeine cessation, sleep hygiene, monitor for and avoid triggers 6.  Consider:  magnesium citrate 400mg  daily, riboflavin 400mg  daily, and coenzyme Q10 100mg  three times daily 7.  Follow up in 6 months   Shon Millet, DO  CC: Berniece Andreas, MD

## 2018-04-22 ENCOUNTER — Ambulatory Visit: Payer: BC Managed Care – PPO | Admitting: Neurology

## 2018-04-22 ENCOUNTER — Encounter: Payer: Self-pay | Admitting: Neurology

## 2018-04-22 ENCOUNTER — Encounter

## 2018-04-22 VITALS — BP 116/70 | HR 67 | Ht 62.0 in | Wt 158.0 lb

## 2018-04-22 DIAGNOSIS — G43009 Migraine without aura, not intractable, without status migrainosus: Secondary | ICD-10-CM | POA: Diagnosis not present

## 2018-04-22 NOTE — Patient Instructions (Signed)
1.  For preventative management, continue Emgality 2.  For abortive therapy, sumatriptan 100mg  3.  Limit use of pain relievers to no more than 2 days out of week to prevent risk of rebound or medication-overuse headache. 4.  Keep headache diary 5.  Exercise, hydration, caffeine cessation, sleep hygiene, monitor for and avoid triggers 6.  Consider:  magnesium citrate 400mg  daily, riboflavin 400mg  daily, and coenzyme Q10 100mg  three times daily 7.  Follow up in 6 months

## 2018-05-18 ENCOUNTER — Other Ambulatory Visit: Payer: Self-pay | Admitting: Neurology

## 2018-06-22 ENCOUNTER — Telehealth: Payer: Self-pay

## 2018-06-22 NOTE — Telephone Encounter (Signed)
lvm for pt to call back to move appt to 10:30 and do [email or web address] virtual visit. Crm created

## 2018-06-23 ENCOUNTER — Encounter: Payer: Self-pay | Admitting: Internal Medicine

## 2018-06-23 ENCOUNTER — Ambulatory Visit (INDEPENDENT_AMBULATORY_CARE_PROVIDER_SITE_OTHER): Payer: BC Managed Care – PPO | Admitting: Internal Medicine

## 2018-06-23 ENCOUNTER — Other Ambulatory Visit: Payer: Self-pay

## 2018-06-23 DIAGNOSIS — E669 Obesity, unspecified: Secondary | ICD-10-CM | POA: Diagnosis not present

## 2018-06-23 DIAGNOSIS — R030 Elevated blood-pressure reading, without diagnosis of hypertension: Secondary | ICD-10-CM | POA: Diagnosis not present

## 2018-06-23 DIAGNOSIS — Z79899 Other long term (current) drug therapy: Secondary | ICD-10-CM

## 2018-06-23 DIAGNOSIS — J45909 Unspecified asthma, uncomplicated: Secondary | ICD-10-CM

## 2018-06-23 DIAGNOSIS — J302 Other seasonal allergic rhinitis: Secondary | ICD-10-CM

## 2018-06-23 MED ORDER — MONTELUKAST SODIUM 10 MG PO TABS: 10.0000 mg | ORAL_TABLET | Freq: Every day | ORAL | 6 refills | Status: AC

## 2018-06-23 MED ORDER — BUDESONIDE-FORMOTEROL FUMARATE 160-4.5 MCG/ACT IN AERO: 2.0000 | INHALATION_SPRAY | Freq: Two times a day (BID) | RESPIRATORY_TRACT | 3 refills | Status: AC

## 2018-06-23 NOTE — Progress Notes (Signed)
Virtual Visit via Video Note  I connected with@ on 06/23/18 at 10:30 AM EDT by a video enabled telemedicine application and verified that I am speaking with the correct person using two identifiers. Location patient: home Location provider:work or home office Persons participating in the virtual visit: patient, provider  [place]th national recommendations  regarding COVID 19 pandemic   video visit is advised over in office visit for this patient.  Discussed the limitations of evaluation and management by telemedicine and  availability of in person appointments. The patient expressed understanding and agreed to proceed.   HPI: [name]   Fu asthma  Began symbicort seems to do some better with less rescue  Use   Upper rres allergy sneezing  When outside some  No fever   Esophagus   Off and on  Stopped prilosec  And using otther  Supplements  Not as bad   Weight  Back up to 151    Has been laid off job   For now   Had a cold after nlast visit tooka while to get better  sinuss now no fever sob    doin ok today   ROS: See pertinent positives and negatives per HPI.  Past Medical History:  Diagnosis Date  . Allergic rhinitis   . Asthma   . Chicken pox    as a child  . Migraines   . Umbilical hernia     Past Surgical History:  Procedure Laterality Date  . ARTHROSCOPIC REPAIR ACL  1991  . NASAL SINUS SURGERY  1999    Family History  Problem Relation Age of Onset  . Diabetes Mellitus II Mother   . Thyroid disease Unknown   . Hypertension Unknown   . Thyroid disease Unknown     SOCIAL HX:    Current Outpatient Medications:  .  albuterol (PROVENTIL HFA;VENTOLIN HFA) 108 (90 Base) MCG/ACT inhaler, Inhale 2 puffs into the lungs every 6 (six) hours as needed for wheezing or shortness of breath., Disp: 1 Inhaler, Rfl: 1 .  Albuterol Sulfate (PROAIR RESPICLICK) 108 (90 Base) MCG/ACT AEPB, Inhale 2 puffs into the lungs every 6 (six) hours as needed., Disp: 1 each,  Rfl: 0 .  aspirin-acetaminophen-caffeine (EXCEDRIN MIGRAINE) 250-250-65 MG per tablet, Take 2 tablets by mouth daily as needed for headache., Disp: , Rfl:  .  budesonide-formoterol (SYMBICORT) 160-4.5 MCG/ACT inhaler, Inhale 2 puffs into the lungs 2 (two) times daily., Disp: 3 Inhaler, Rfl: 3 .  cetirizine (ZYRTEC) 10 MG tablet, Take 10 mg by mouth daily., Disp: , Rfl:  .  Cholecalciferol (VITAMIN D3) 1000 UNITS CAPS, Take 1 capsule by mouth daily., Disp: , Rfl:  .  Flaxseed MISC, by Does not apply route. Whole ground, Disp: , Rfl:  .  Galcanezumab-gnlm (EMGALITY) 120 MG/ML SOSY, Inject 120 mg into the skin every 30 (thirty) days., Disp: 1 Syringe, Rfl: 11 .  montelukast (SINGULAIR) 10 MG tablet, Take 1 tablet (10 mg total) by mouth at bedtime. For allergy/ asthma control, Disp: 30 tablet, Rfl: 6 .  Omega-3 Fatty Acids (OMEGA 3 PO), Take 1 capsule by mouth 2 (two) times daily., Disp: , Rfl:  .  omeprazole (PRILOSEC) 20 MG capsule, Take 1 capsule (20 mg total) by mouth daily. Or as directed, Disp: 30 capsule, Rfl: 5 .  OVER THE COUNTER MEDICATION, chaga 100 % pure, Disp: , Rfl:  .  OVER THE COUNTER MEDICATION, NRG:  B6-B12 ONCE DAILY, Disp: , Rfl:  .  OVER THE  COUNTER MEDICATION, [place], Disp: , Rfl:  .  OVER THE COUNTER MEDICATION, GANO 100% PURE EXTRACT, Disp: , Rfl:  .  Probiotic Product (PROBIOTIC PO), Take 1 capsule by mouth daily. Green Vibrance, Disp: , Rfl:  .  QVAR REDIHALER 40 MCG/ACT inhaler, INHALE 1 TO 2 PUFFS INTO THE LUNGS DAILY OR AS DIRECTED, Disp: 10.6 g, Rfl: 5 .  SUMAtriptan (IMITREX) 100 MG tablet, SEE NOTES, Disp: 10 tablet, Rfl: 5 .  triamcinolone (NASACORT ALLERGY 24HR) 55 MCG/ACT AERO nasal inhaler, Place 2 sprays into the nose daily., Disp: , Rfl:  .  TURMERIC PO, Take 1 tablet by mouth 3 (three) times daily., Disp: , Rfl:  .  WHEY PROTEIN PO, Take 60 g by mouth., Disp: , Rfl:  EXAM:  VITALS per patient if applicable: looks well  No cough during visit  bp reported as  111/70 this am and weight 151( fluctuates down to 146)  GENERAL: alert, oriented, appears well and in no acute distress  HEENT: atraumatic, conjunttiva clear, no obvious abnormalities on inspection of external nose and ears  NECK: normal movements of the head and neck  LUNGS: on inspection no signs of respiratory distress, breathing rate appears normal, no obvious gross SOB, gasping or wheezing  CV: no obvious cyanosis  MS: moves all visible extremities without noticeable abnormality  PSYCH/NEURO: pleasant and cooperative, no obvious depression or anxiety, speech and thought processing grossly intact Lab Results  Component Value Date   WBC 4.8 03/24/2018   HGB 15.2 03/24/2018   HCT 45.3 03/24/2018   PLT 217.0 03/24/2018   GLUCOSE 93 03/24/2018   CHOL 163 03/24/2018   TRIG 78.0 03/24/2018   HDL 61.50 03/24/2018   LDLCALC 86 03/24/2018   ALT 30 03/24/2018   AST 29 03/24/2018   NA 139 03/24/2018   K 5.0 03/24/2018   CL 101 03/24/2018   CREATININE 0.98 03/24/2018   BUN 13 03/24/2018   CO2 32 03/24/2018   TSH 0.83 02/14/2016   PSA 0.54 03/24/2018   HGBA1C 5.7 09/13/2013    ASSESSMENT AND PLAN:  Discussed the following assessment and plan:  Mild asthma without complication, unspecified whether persistent - better on symbicort but add trial singulair  avoidance etc    Medication management  Obesity (BMI 30-39.9)  Elevated BP without diagnosis of hypertension - good readings now  Seasonal allergic rhinitis, unspecified trigger sx poss pollen Stay on syymbicort  Instead of q var  Add singulair for ur all sx sneezing etc and observe . Back to healthy weight loss plans   Plan 3 mos rov virtual or person about asthma and meds     Expectant management and discussion of plan and treatment with patient with opportunity to ask questions and all were answered. The patient agreed with the plan and demonstrated an understanding of the instructions.   The patient was advised  to call back or seek an in-person evaluation if worsening having concerns    or if the condition fails to improve as anticipated.     Berniece Andreas, MD

## 2018-09-24 ENCOUNTER — Ambulatory Visit: Payer: BC Managed Care – PPO | Admitting: Internal Medicine

## 2018-09-29 NOTE — Progress Notes (Deleted)
Virtual Visit via Video Note  I connected with@ on 09/30/18 at  3:00 PM EDT by a video enabled telemedicine application and verified that I am speaking with the correct person using two identifiers. Location patient: home Location provider:work or home office Persons participating in the virtual visit: patient, provider  [place] national recommendations  regarding COVID 19 pandemic   video visit is advised over in office visit for this patient.  Patient aware  of the limitations of evaluation and management by telemedicine and  availability of in person appointments. and agreed to proceed.   HPI: [name] presents for video visit    09/22/18 8:32 AM Because I am back at work part time I had to cancel my appointment . I still have some problems with my esophagus .My nephew believes it could be food allergies. There are no acid reflux problems . Today my weight is 145lbs. bp is 115/67/95. what do I need to do about my esophagus?  exerise is 5 to six days a week with min. being 4  ROS: See pertinent positives and negatives per HPI.  Past Medical History:  Diagnosis Date  . Allergic rhinitis   . Asthma   . Chicken pox    as a child  . Migraines   . Umbilical hernia     Past Surgical History:  Procedure Laterality Date  . ARTHROSCOPIC REPAIR ACL  1991  . NASAL SINUS SURGERY  1999    Family History  Problem Relation Age of Onset  . Diabetes Mellitus II Mother   . Thyroid disease Unknown   . Hypertension Unknown   . Thyroid disease Unknown     Social History   Tobacco Use  . Smoking status: Never Smoker  . Smokeless tobacco: Never Used  Substance Use Topics  . Alcohol use: No    Alcohol/week: 0.0 standard drinks  . Drug use: No      Current Outpatient Medications:  .  albuterol (PROVENTIL HFA;VENTOLIN HFA) 108 (90 Base) MCG/ACT inhaler, Inhale 2 puffs into the lungs every 6 (six) hours as needed for wheezing or shortness of breath., Disp: 1 Inhaler, Rfl:  1 .  Albuterol Sulfate (PROAIR RESPICLICK) 108 (90 Base) MCG/ACT AEPB, Inhale 2 puffs into the lungs every 6 (six) hours as needed., Disp: 1 each, Rfl: 0 .  aspirin-acetaminophen-caffeine (EXCEDRIN MIGRAINE) 250-250-65 MG per tablet, Take 2 tablets by mouth daily as needed for headache., Disp: , Rfl:  .  budesonide-formoterol (SYMBICORT) 160-4.5 MCG/ACT inhaler, Inhale 2 puffs into the lungs 2 (two) times daily., Disp: 3 Inhaler, Rfl: 3 .  cetirizine (ZYRTEC) 10 MG tablet, Take 10 mg by mouth daily., Disp: , Rfl:  .  Cholecalciferol (VITAMIN D3) 1000 UNITS CAPS, Take 1 capsule by mouth daily., Disp: , Rfl:  .  Flaxseed MISC, by Does not apply route. Whole ground, Disp: , Rfl:  .  Galcanezumab-gnlm (EMGALITY) 120 MG/ML SOSY, Inject 120 mg into the skin every 30 (thirty) days., Disp: 1 Syringe, Rfl: 11 .  montelukast (SINGULAIR) 10 MG tablet, Take 1 tablet (10 mg total) by mouth at bedtime. For allergy/ asthma control, Disp: 30 tablet, Rfl: 6 .  Omega-3 Fatty Acids (OMEGA 3 PO), Take 1 capsule by mouth 2 (two) times daily., Disp: , Rfl:  .  omeprazole (PRILOSEC) 20 MG capsule, Take 1 capsule (20 mg total) by mouth daily. Or as directed, Disp: 30 capsule, Rfl: 5 .  OVER THE COUNTER MEDICATION, chaga 100 % pure, Disp: , Rfl:  .  OVER THE COUNTER MEDICATION, NRG:  B6-B12 ONCE DAILY, Disp: , Rfl:  .  OVER THE COUNTER MEDICATION, [place], Disp: , Rfl:  .  OVER THE COUNTER MEDICATION, GANO 100% PURE EXTRACT, Disp: , Rfl:  .  Probiotic Product (PROBIOTIC PO), Take 1 capsule by mouth daily. Green Vibrance, Disp: , Rfl:  .  QVAR REDIHALER 40 MCG/ACT inhaler, INHALE 1 TO 2 PUFFS INTO THE LUNGS DAILY OR AS DIRECTED, Disp: 10.6 g, Rfl: 5 .  SUMAtriptan (IMITREX) 100 MG tablet, SEE NOTES, Disp: 10 tablet, Rfl: 5 .  triamcinolone (NASACORT ALLERGY 24HR) 55 MCG/ACT AERO nasal inhaler, Place 2 sprays into the nose daily., Disp: , Rfl:  .  TURMERIC PO, Take 1 tablet by mouth 3 (three) times daily., Disp: , Rfl:  .   WHEY PROTEIN PO, Take 60 g by mouth., Disp: , Rfl:   EXAM: BP Readings from Last 3 Encounters:  04/22/18 116/70  03/24/18 130/84  10/10/17 130/76    VITALS per patient if applicable:  GENERAL: alert, oriented, appears well and in no acute distress  HEENT: atraumatic, conjunttiva clear, no obvious abnormalities on inspection of external nose and ears  NECK: normal movements of the head and neck  LUNGS: on inspection no signs of respiratory distress, breathing rate appears normal, no obvious gross SOB, gasping or wheezing  CV: no obvious cyanosis  MS: moves all visible extremities without noticeable abnormality  PSYCH/NEURO: pleasant and cooperative, no obvious depression or anxiety, speech and thought processing grossly intact Lab Results  Component Value Date   WBC 4.8 03/24/2018   HGB 15.2 03/24/2018   HCT 45.3 03/24/2018   PLT 217.0 03/24/2018   GLUCOSE 93 03/24/2018   CHOL 163 03/24/2018   TRIG 78.0 03/24/2018   HDL 61.50 03/24/2018   LDLCALC 86 03/24/2018   ALT 30 03/24/2018   AST 29 03/24/2018   NA 139 03/24/2018   K 5.0 03/24/2018   CL 101 03/24/2018   CREATININE 0.98 03/24/2018   BUN 13 03/24/2018   CO2 32 03/24/2018   TSH 0.83 02/14/2016   PSA 0.54 03/24/2018   HGBA1C 5.7 09/13/2013    ASSESSMENT AND PLAN:  Discussed the following assessment and plan:  No diagnosis found.  Counseled.   Expectant management and discussion of plan and treatment with opportunity to ask questions and all were answered. The patient agreed with the plan and demonstrated an understanding of the instructions.   Advised to call back or seek an in-person evaluation if worsening  or having  further concerns .  I provided *** minutes of non-face-to-face time during this encounter.   Shanon Ace, MD

## 2018-09-30 ENCOUNTER — Ambulatory Visit: Payer: BC Managed Care – PPO | Admitting: Internal Medicine

## 2018-10-02 NOTE — Progress Notes (Signed)
Virtual Visit via Video Note  I connected with@ on 10/05/18 at  3:15 PM EDT by a video enabled telemedicine application and verified that I am speaking with the correct person using two identifiers. Location patient: home Location provider:work  office Persons participating in the virtual visit: patient, provider  [place] national recommendations  regarding COVID 19 pandemic   video visit is advised over in office visit for this patient.  Patient aware  of the limitations of evaluation and management by telemedicine and  availability of in person appointments. and agreed to proceed.   HPI: [name] presents for video visit  Concern about possibility of allergy to food causing sx   As brother had some sx. asthma stable  And allergies seem ok since last visit   Getting intermittent swallowing issues  With  upper area  Of hard to swallow  Points to upper mid chest  ocass food comes back up  But no vomiting,  No specific foods but cold doesn't do as well so  Does warm liquids  No acid reflux He has a remote hx  Of what sounds like stretching  Or dilatation of esophagus and  Was supposed  to have and eva at some point ?   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 No fever  No unintentional weight loss   Working on weight loss   Past Medical History:  Diagnosis Date  . Allergic rhinitis   . Asthma   . Chicken pox    as a child  . Migraines   . Umbilical hernia     Past Surgical History:  Procedure Laterality Date  . ARTHROSCOPIC REPAIR ACL  1991  . NASAL SINUS SURGERY  1999    Family History  Problem Relation Age of Onset  . Diabetes Mellitus II Mother   . Thyroid disease Unknown   . Hypertension Unknown   . Thyroid disease Unknown     Social History   Tobacco Use  . Smoking status: Never Smoker  . Smokeless tobacco: Never Used  Substance Use Topics  . Alcohol use: No     Alcohol/week: 0.0 standard drinks  . Drug use: No      Current Outpatient Medications:  .  albuterol (PROVENTIL HFA;VENTOLIN HFA) 108 (90 Base) MCG/ACT inhaler, Inhale 2 puffs into the lungs every 6 (six) hours as needed for wheezing or shortness of breath., Disp: 1 Inhaler, Rfl: 1 .  Albuterol Sulfate (PROAIR RESPICLICK) 108 (90 Base) MCG/ACT AEPB, Inhale 2 puffs into the lungs every 6 (six) hours as needed., Disp: 1 each, Rfl: 0 .  aspirin-acetaminophen-caffeine (EXCEDRIN MIGRAINE) 250-250-65 MG per tablet, Take 2 tablets by mouth daily as needed for headache., Disp: , Rfl:  .  budesonide-formoterol (SYMBICORT) 160-4.5 MCG/ACT inhaler, Inhale 2 puffs into the lungs 2 (two) times daily., Disp: 3 Inhaler, Rfl: 3 .  cetirizine (ZYRTEC) 10 MG tablet, Take 10 mg by mouth daily., Disp: , Rfl:  .  Cholecalciferol (VITAMIN D3) 1000 UNITS CAPS, Take 1 capsule by mouth daily., Disp: , Rfl:  .  Flaxseed MISC, by Does not apply route. Whole ground, Disp: , Rfl:  .  Galcanezumab-gnlm (EMGALITY) 120 MG/ML SOSY, Inject 120 mg into the skin every 30 (thirty) days., Disp: 1 Syringe, Rfl: 11 .  montelukast (SINGULAIR) 10 MG tablet, Take 1 tablet (10 mg total) by mouth at bedtime. For allergy/ asthma control,  Disp: 30 tablet, Rfl: 6 .  Omega-3 Fatty Acids (OMEGA 3 PO), Take 1 capsule by mouth 2 (two) times daily., Disp: , Rfl:  .  omeprazole (PRILOSEC) 20 MG capsule, Take 1 capsule (20 mg total) by mouth daily. Or as directed, Disp: 30 capsule, Rfl: 5 .  OVER THE COUNTER MEDICATION, chaga 100 % pure, Disp: , Rfl:  .  OVER THE COUNTER MEDICATION, NRG:  B6-B12 ONCE DAILY, Disp: , Rfl:  .  OVER THE COUNTER MEDICATION, [place], Disp: , Rfl:  .  OVER THE COUNTER MEDICATION, GANO 100% PURE EXTRACT, Disp: , Rfl:  .  Probiotic Product (PROBIOTIC PO), Take 1 capsule by mouth daily. Green Vibrance, Disp: , Rfl:  .  QVAR REDIHALER 40 MCG/ACT inhaler, INHALE 1 TO 2 PUFFS INTO THE LUNGS DAILY OR AS DIRECTED, Disp: 10.6 g,  Rfl: 5 .  SUMAtriptan (IMITREX) 100 MG tablet, SEE NOTES, Disp: 10 tablet, Rfl: 5 .  triamcinolone (NASACORT ALLERGY 24HR) 55 MCG/ACT AERO nasal inhaler, Place 2 sprays into the nose daily., Disp: , Rfl:  .  TURMERIC PO, Take 1 tablet by mouth 3 (three) times daily., Disp: , Rfl:  .  WHEY PROTEIN PO, Take 60 g by mouth., Disp: , Rfl:   EXAM: BP Readings from Last 3 Encounters:  04/22/18 116/70  03/24/18 130/84  10/10/17 130/76    VITALS per patient if applicable: 242/35 p 95 weight 144  GENERAL: alert, oriented, appears well and in no acute distress HEENT: atraumatic, conjunttiva clear, no obvious abnormalities on inspection of external nose and ears NECK: normal movements of the head and neck LUNGS: on inspection no signs of respiratory distress, breathing rate appears normal, no obvious gross SOB, gasping or wheezing CV: no obvious cyanosis  PSYCH/NEURO: pleasant and cooperative, no obvious depression or anxiety, speech and thought processing grossly intact Lab Results  Component Value Date   WBC 4.8 03/24/2018   HGB 15.2 03/24/2018   HCT 45.3 03/24/2018   PLT 217.0 03/24/2018   GLUCOSE 93 03/24/2018   CHOL 163 03/24/2018   TRIG 78.0 03/24/2018   HDL 61.50 03/24/2018   LDLCALC 86 03/24/2018   ALT 30 03/24/2018   AST 29 03/24/2018   NA 139 03/24/2018   K 5.0 03/24/2018   CL 101 03/24/2018   CREATININE 0.98 03/24/2018   BUN 13 03/24/2018   CO2 32 03/24/2018   TSH 0.83 02/14/2016   PSA 0.54 03/24/2018   HGBA1C 5.7 09/13/2013    ASSESSMENT AND PLAN:  Discussed the following assessment and plan:    ICD-10-CM   1. Dysphagia, unspecified type  R13.10 Ambulatory referral to Gastroenterology  2. History of esophageal dilatation  Z98.890 Ambulatory referral to Gastroenterology  3. Mild asthma without complication, unspecified whether persistent  J45.909 Ambulatory referral to Gastroenterology  4. Seasonal allergic rhinitis, unspecified trigger sx poss pollen  J30.2     Presumed asthma underlying and allergy but this sounds esophageal    referral to GI  Poss recurrence of esophageal  Narrowing ? EOE?  Possible    No acute sx today . No alarm sx .  Underlying allergy noted but no obv  Food  Immediate Allergic response   Expectant management and discussion of plan and treatment with opportunity to ask questions and all were answered. The patient agreed with the plan and demonstrated an understanding of the instructions.   Advised to call back or seek an in-person evaluation if worsening  or having  further concerns . In interim  Shanon Ace, MD

## 2018-10-05 ENCOUNTER — Encounter: Payer: Self-pay | Admitting: Internal Medicine

## 2018-10-05 ENCOUNTER — Other Ambulatory Visit: Payer: Self-pay

## 2018-10-05 ENCOUNTER — Ambulatory Visit (INDEPENDENT_AMBULATORY_CARE_PROVIDER_SITE_OTHER): Payer: BC Managed Care – PPO | Admitting: Internal Medicine

## 2018-10-05 DIAGNOSIS — J45909 Unspecified asthma, uncomplicated: Secondary | ICD-10-CM | POA: Diagnosis not present

## 2018-10-05 DIAGNOSIS — J302 Other seasonal allergic rhinitis: Secondary | ICD-10-CM | POA: Diagnosis not present

## 2018-10-05 DIAGNOSIS — Z9889 Other specified postprocedural states: Secondary | ICD-10-CM | POA: Diagnosis not present

## 2018-10-05 DIAGNOSIS — R1310 Dysphagia, unspecified: Secondary | ICD-10-CM | POA: Diagnosis not present

## 2018-10-18 NOTE — Progress Notes (Signed)
Virtual Visit via Video Note The purpose of this virtual visit is to provide medical care while limiting exposure to the novel coronavirus.    Consent was obtained for video visit:  Yes Answered questions that patient had about telehealth interaction:  Yes I discussed the limitations, risks, security and privacy concerns of performing an evaluation and management service by telemedicine. I also discussed with the patient that there may be a patient responsible charge related to this service. The patient expressed understanding and agreed to proceed.  Pt location: Home Physician Location: Home Name of referring provider:  Madelin HeadingsPanosh, Wanda K, MD I connected with [name] at patients initiation/request on 10/19/2018 at  3:30 PM EDT by video enabled telemedicine application and verified that I am speaking with the correct person using two identifiers. Pt MRN:  [id number] Pt DOB:  1955/09/12 Video Participants:  [name]   History of Present Illness:  [name] is a 63 year old left-handed man who follows up for migraine.  UPDATE: Intensity:  Moderate to severe Duration:  1 to 3 hours Frequency:  2 to 5 days a month, usually in last week prior to next injection. Frequency of abortive medication: 2 to 3 days  Current NSAIDS:  none Current analgesics:  none Current triptans:  Sumatriptan 100mg  Current ergotamine:  none Current anti-emetic:  none Current muscle relaxants:  none Current anti-anxiolytic:  none Current sleep aide:  none Current Antihypertensive medications:  none Current Antidepressant medications:  none Current Anticonvulsant medications:  none Current anti-CGRP:  Emgality Current Vitamins/Herbal/Supplements:  Resveratrol, [place] Tea, Chaga, [place], Omega 3, B6, B12, Turmeric Current Antihistamines/Decongestants:  Zyrtec Other therapy:  none  Diet:  Avoids sugars/low carbs.  Hydrates. Depression/anxiety:  No Other pain:  No  HISTORY: Onset:  63  years old Location:Left greater than right sided Quality:Pressure, throbbing Initial intensity:2-10/10 (usually 6-7/10) Aura:no Prodrome:yawning Associated symptoms: Nausea, dizziness, phonophobia, sometimes sees spots, photophobia. No unilateral numbness or weakness. Initial Duration:2 days (however this current headache has been going on for almost a week) Initial Frequency:8-10 days per month Triggers:  Smell of burning rope Relieving factors:  None Activity:Needs to lay down 4-5 days per month  MRI of the brain with and without contrast was performed on 02/04/14, which showed non-specific white matter signal changes but nothing acute.  Past NSAIDS: Ibuprofen, naproxen Past analgesics: Tylenol, Fioricet, Excedrin Migraine Past abortive triptans: None Past abortive ergotamine: None Past muscle relaxants: None Past anti-emetic: None Past antihypertensive medications: atenolol Past antidepressant medications: Amitriptyline 100 mg (discontinued because it made him "incapacitated ") Past anticonvulsant medications: Topiramate 100 mg at bedtime (stopped due to concern of potential side effects) Past anti-CGRP: Aimovig ([facility] because he had difficulty getting it at his pharmacy). Past vitamins/Herbal/Supplements: None Past antihistamines/decongestants: Benadryl Other past therapies: None  Past Medical History: Past Medical History:  Diagnosis Date  . Allergic rhinitis   . Asthma   . Chicken pox    as a child  . Migraines   . Umbilical hernia     Medications: Outpatient Encounter Medications as of 10/19/2018  Medication Sig  . albuterol (PROVENTIL HFA;VENTOLIN HFA) 108 (90 Base) MCG/ACT inhaler Inhale 2 puffs into the lungs every 6 (six) hours as needed for wheezing or shortness of breath.  . Albuterol Sulfate (PROAIR RESPICLICK) 108 (90 Base) MCG/ACT AEPB Inhale 2 puffs into the lungs every 6 (six) hours as needed.  Marland Kitchen.  aspirin-acetaminophen-caffeine (EXCEDRIN MIGRAINE) 250-250-65 MG per tablet Take 2 tablets by mouth daily as  needed for headache.  . budesonide-formoterol (SYMBICORT) 160-4.5 MCG/ACT inhaler Inhale 2 puffs into the lungs 2 (two) times daily.  . cetirizine (ZYRTEC) 10 MG tablet Take 10 mg by mouth daily.  . Cholecalciferol (VITAMIN D3) 1000 UNITS CAPS Take 1 capsule by mouth daily.  . Flaxseed MISC by Does not apply route. Whole ground  . Galcanezumab-gnlm (EMGALITY) 120 MG/ML SOSY Inject 120 mg into the skin every 30 (thirty) days.  . montelukast (SINGULAIR) 10 MG tablet Take 1 tablet (10 mg total) by mouth at bedtime. For allergy/ asthma control  . Omega-3 Fatty Acids (OMEGA 3 PO) Take 1 capsule by mouth 2 (two) times daily.  Marland Kitchen omeprazole (PRILOSEC) 20 MG capsule Take 1 capsule (20 mg total) by mouth daily. Or as directed  . OVER THE COUNTER MEDICATION chaga 100 % pure  . OVER THE COUNTER MEDICATION NRG:  B6-B12 ONCE DAILY  . OVER THE COUNTER MEDICATION [place]  . OVER THE COUNTER MEDICATION GANO 100% PURE EXTRACT  . Probiotic Product (PROBIOTIC PO) Take 1 capsule by mouth daily. Green Vibrance  . QVAR REDIHALER 40 MCG/ACT inhaler INHALE 1 TO 2 PUFFS INTO THE LUNGS DAILY OR AS DIRECTED  . SUMAtriptan (IMITREX) 100 MG tablet SEE NOTES  . triamcinolone (NASACORT ALLERGY 24HR) 55 MCG/ACT AERO nasal inhaler Place 2 sprays into the nose daily.  . TURMERIC PO Take 1 tablet by mouth 3 (three) times daily.  . WHEY PROTEIN PO Take 60 g by mouth.   No facility-administered encounter medications on file as of 10/19/2018.     Allergies: No Known Allergies  Family History: Family History  Problem Relation Age of Onset  . Diabetes Mellitus II Mother   . Thyroid disease Unknown   . Hypertension Unknown   . Thyroid disease Unknown     Social History: Social History   Socioeconomic History  . Marital status: Single    Spouse name: Not on file  . Number of children: Not on file  . Years of  education: Not on file  . Highest education level: Not on file  Occupational History  . Not on file  Social Needs  . Financial resource strain: Not on file  . Food insecurity    Worry: Not on file    Inability: Not on file  . Transportation needs    Medical: Not on file    Non-medical: Not on file  Tobacco Use  . Smoking status: Never Smoker  . Smokeless tobacco: Never Used  Substance and Sexual Activity  . Alcohol use: No    Alcohol/week: 0.0 standard drinks  . Drug use: No  . Sexual activity: Never  Lifestyle  . Physical activity    Days per week: Not on file    Minutes per session: Not on file  . Stress: Not on file  Relationships  . Social Herbalist on phone: Not on file    Gets together: Not on file    Attends religious service: Not on file    Active member of club or organization: Not on file    Attends meetings of clubs or organizations: Not on file    Relationship status: Not on file  . Intimate partner violence    Fear of current or ex partner: Not on file    Emotionally abused: Not on file    Physically abused: Not on file    Forced sexual activity: Not on file  Other Topics Concern  . 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 No acute distress.  Alert and oriented.  Speech fluent and not dysarthric.  Language intact.  Eyes orthophoric on primary gaze.  Face symmetric.  Assessment and Plan:   Migraine without aura, without status migrainosus, not intractable  1.  For preventative management, Emgality 2.  For abortive therapy, sumatriptan 100mg  3.  Limit use of pain relievers to no more than 2 days out of week to prevent risk of rebound or medication-overuse headache. 4.  Keep headache diary 5.  Exercise, hydration,  caffeine cessation, sleep hygiene, monitor for and avoid triggers 6.  Consider:  magnesium citrate 400mg  daily, riboflavin 400mg  daily, and coenzyme Q10 100mg  three times daily 7. Always keep in mind that currently taking a hormone or birth control may be a possible trigger or aggravating factor for migraine. 8. Follow up 6 months  Follow Up Instructions:    -I discussed the assessment and treatment plan with the patient. The patient was provided an opportunity to ask questions and all were answered. The patient agreed with the plan and demonstrated an understanding of the instructions.   The patient was advised to call back or seek an in-person evaluation if the symptoms worsen or if the condition fails to improve as anticipated.   Cira ServantAdam Robert Perris Tripathi, DO

## 2018-10-19 ENCOUNTER — Other Ambulatory Visit: Payer: Self-pay

## 2018-10-19 ENCOUNTER — Encounter: Payer: Self-pay | Admitting: Neurology

## 2018-10-19 ENCOUNTER — Telehealth (INDEPENDENT_AMBULATORY_CARE_PROVIDER_SITE_OTHER): Payer: BC Managed Care – PPO | Admitting: Neurology

## 2018-10-19 VITALS — Ht 60.0 in | Wt 146.0 lb

## 2018-10-19 DIAGNOSIS — G43009 Migraine without aura, not intractable, without status migrainosus: Secondary | ICD-10-CM

## 2018-10-22 ENCOUNTER — Ambulatory Visit: Payer: BC Managed Care – PPO | Admitting: Neurology

## 2018-10-23 ENCOUNTER — Encounter: Payer: Self-pay | Admitting: Gastroenterology

## 2018-10-23 ENCOUNTER — Ambulatory Visit: Payer: BC Managed Care – PPO | Admitting: Gastroenterology

## 2018-10-23 VITALS — BP 120/78 | HR 87 | Temp 98.5°F | Ht 62.0 in | Wt 149.0 lb

## 2018-10-23 DIAGNOSIS — Z1211 Encounter for screening for malignant neoplasm of colon: Secondary | ICD-10-CM | POA: Diagnosis not present

## 2018-10-23 DIAGNOSIS — R1310 Dysphagia, unspecified: Secondary | ICD-10-CM | POA: Diagnosis not present

## 2018-10-23 NOTE — Progress Notes (Signed)
10/23/2018 [place] Livingshomas H Koffman [id number] 09-14-1955   HISTORY OF PRESENT ILLNESS: This is a 63 year old male who looks like he was previously known to Dr. Leone PayorGessner from 2011.  He was here at that time complaining of reflux and complaints of dysphasia.  Was scheduled for EGD with possible dilation, but then stated that he could not get off work and never had that performed.  He is here again today saying that food sometimes still feels like it is in his chest when he eats.  He says that it does not really seem to get stuck, that it will move down, however, leaves a sensation that there is still something there for a while.  Does not have trouble with the food actually going down.  He says that he had issues with acid reflux in the past, but he got that under control and that is been fine, not currently taking any medication for that.  Never had a colonoscopy in the past.  Had one scheduled previously in 2011 as well, but once again, said that he could not get off work so never had a performed.  He says he is not really interested in having one performed at this time, but after further discussion he agreed to perform Cologuard and says that if positive he would consider colonoscopy.  He denies any family history of colon cancer.  No complaints of bowel issues or rectal bleeding.  Was referred here by Dr. Fabian SharpPanosh.   Past Medical History:  Diagnosis Date  . Allergic rhinitis   . Asthma   . Chicken pox    as a child  . Migraines   . Umbilical hernia    Past Surgical History:  Procedure Laterality Date  . ARTHROSCOPIC REPAIR ACL  1991  . NASAL SINUS SURGERY  1999    reports that he has never smoked. He has never used smokeless tobacco. He reports that he does not drink alcohol or use drugs. family history includes Diabetes Mellitus II in his mother; Hypertension in an other family member; Thyroid disease in some other family members. No Known Allergies    Outpatient Encounter Medications as  of 10/23/2018  Medication Sig  . albuterol (PROVENTIL HFA;VENTOLIN HFA) 108 (90 Base) MCG/ACT inhaler Inhale 2 puffs into the lungs every 6 (six) hours as needed for wheezing or shortness of breath.  . Albuterol Sulfate (PROAIR RESPICLICK) 108 (90 Base) MCG/ACT AEPB Inhale 2 puffs into the lungs every 6 (six) hours as needed.  Marland Kitchen. aspirin-acetaminophen-caffeine (EXCEDRIN MIGRAINE) 250-250-65 MG per tablet Take 2 tablets by mouth daily as needed for headache.  . budesonide-formoterol (SYMBICORT) 160-4.5 MCG/ACT inhaler Inhale 2 puffs into the lungs 2 (two) times daily.  . cetirizine (ZYRTEC) 10 MG tablet Take 10 mg by mouth daily.  . Cholecalciferol (VITAMIN D3) 1000 UNITS CAPS Take 1 capsule by mouth daily.  . Flaxseed MISC by Does not apply route. Whole ground  . Galcanezumab-gnlm (EMGALITY) 120 MG/ML SOSY Inject 120 mg into the skin every 30 (thirty) days.  . montelukast (SINGULAIR) 10 MG tablet Take 1 tablet (10 mg total) by mouth at bedtime. For allergy/ asthma control  . Omega-3 Fatty Acids (OMEGA 3 PO) Take 1 capsule by mouth 2 (two) times daily.  Marland Kitchen. omeprazole (PRILOSEC) 20 MG capsule Take 1 capsule (20 mg total) by mouth daily. Or as directed  . OVER THE COUNTER MEDICATION chaga 100 % pure  . OVER THE COUNTER MEDICATION NRG:  B6-B12 ONCE DAILY  . OVER  THE COUNTER MEDICATION [place]  . OVER THE COUNTER MEDICATION GANO 100% PURE EXTRACT  . Probiotic Product (PROBIOTIC PO) Take 1 capsule by mouth daily. Green Vibrance  . triamcinolone (NASACORT ALLERGY 24HR) 55 MCG/ACT AERO nasal inhaler Place 2 sprays into the nose daily.  . TURMERIC PO Take 1 tablet by mouth 3 (three) times daily.  . WHEY PROTEIN PO Take 60 g by mouth.  . [DISCONTINUED] QVAR REDIHALER 40 MCG/ACT inhaler INHALE 1 TO 2 PUFFS INTO THE LUNGS DAILY OR AS DIRECTED  . [DISCONTINUED] SUMAtriptan (IMITREX) 100 MG tablet SEE NOTES   No facility-administered encounter medications on file as of 10/23/2018.      REVIEW OF SYSTEMS  :  All other systems reviewed and negative except where noted in the History of Present Illness.   PHYSICAL EXAM: BP 120/78   Pulse 87   Temp 98.5 F (36.9 C)   Ht 5\' 2"  (1.575 m)   Wt 149 lb (67.6 kg)   BMI 27.25 kg/m  General: Well developed male in no acute distress Head: Normocephalic and atraumatic Eyes:  Sclerae anicteric, conjunctiva pink. Ears: Normal auditory acuity Lungs: Clear throughout to auscultation; no increased WOB. Heart: Regular rate and rhythm; no M/R/G. Abdomen: Soft, non-distended.  BS present.  Non-tender. Musculoskeletal: Symmetrical with no gross deformities  Skin: No lesions on visible extremities Extremities: No edema  Neurological: Alert oriented x 4, grossly non-focal Psychological:  Alert and cooperative. Normal mood and affect  ASSESSMENT AND PLAN: *Dysphagia/globus sensation: Patient not taking acid reflux medication as he says he is not having any issues with that.  Needs EGD with possible dilation.  We will schedule Dr. Carlean Purl. *Screening for CRC: Not really interested in colonoscopy at this time, but did agree to have Cologuard and would then consider colonoscopy if that study was positive.  Will order order Cologuard.  **The risks, benefits, and alternatives to EGD with dilation were discussed with the patient and he consents to proceed.  CC:  Panosh, Standley Brooking, MD

## 2018-10-23 NOTE — Patient Instructions (Addendum)
You have been scheduled for an endoscopy. Please follow written instructions given to you at your visit today. If you use inhalers (even only as needed), please bring them with you on the day of your procedure. Your physician has requested that you go to www.startemmi.com and enter the access code given to you at your visit today. This web site gives a general overview about your procedure. However, you should still follow specific instructions given to you by our office regarding your preparation for the procedure. ____________________________ Your provider has ordered Cologuard testing as an option for colon cancer screening. This is performed by [employer] and may be out of network with your insurance. PRIOR to completing the test, it is YOUR responsibility to contact your insurance about covered benefits for this test. Your out of pocket expense could be anywhere from $0.00 to $649.00.   When you call to check coverage with your insurer, please provide the following information:   -The ONLY provider of Cologuard is [place] code for Cologuard is [phone].  [employer] NPI # [id number]  -[employer] Tax ID # [id number]   We have already sent your demographic and insurance information to [employer] (phone number [phone]) and they should contact you within the next week regarding your test. If you have not heard from them within the next week, please call our office at [phone]. _______________________________ If you are age 46 or older, your body mass index should be between 23-30. Your Body mass index is 27.25 kg/m. If this is out of the aforementioned range listed, please consider follow up with your Primary Care Provider.  If you are age 66 or younger, your body mass index should be between 19-25. Your Body mass index is 27.25 kg/m. If this is out of the aformentioned range listed, please consider follow up with your  Primary Care Provider.

## 2018-10-29 ENCOUNTER — Telehealth: Payer: Self-pay | Admitting: Internal Medicine

## 2018-10-29 NOTE — Telephone Encounter (Signed)
Covid-19 Screening Questions     Do you now or have you had a fever in the last 14 days?        Do you have any respiratory symptoms of shortness of breath or cough now or in the last 14 days?       Do you have any family members or close contacts with diagnosed or suspected Covid-19 in the past 14 days?        Have you been tested for Covid-19 and found to be positive?       Please make pt aware of that care partner may wait in the car or come up to the lobby during the procedure but will need to provide their own mask.

## 2018-10-30 ENCOUNTER — Encounter: Payer: Self-pay | Admitting: Internal Medicine

## 2018-10-30 ENCOUNTER — Other Ambulatory Visit: Payer: Self-pay

## 2018-10-30 ENCOUNTER — Ambulatory Visit (AMBULATORY_SURGERY_CENTER): Payer: BC Managed Care – PPO | Admitting: Internal Medicine

## 2018-10-30 VITALS — BP 132/85 | HR 67 | Temp 98.0°F | Resp 20 | Ht 62.0 in | Wt 149.0 lb

## 2018-10-30 DIAGNOSIS — K222 Esophageal obstruction: Secondary | ICD-10-CM | POA: Diagnosis not present

## 2018-10-30 DIAGNOSIS — K449 Diaphragmatic hernia without obstruction or gangrene: Secondary | ICD-10-CM

## 2018-10-30 DIAGNOSIS — R1310 Dysphagia, unspecified: Secondary | ICD-10-CM

## 2018-10-30 MED ORDER — SODIUM CHLORIDE 0.9 % IV SOLN: 500.0000 mL | Freq: Once | INTRAVENOUS | Status: AC

## 2018-10-30 NOTE — Patient Instructions (Addendum)
There was a benign stricture (narrow area of scar tissue) in the esophagus where it meets the stomach. It has been causing your problems. I dilated it to allow you to swallow better.  I hope this resolves your swallowing problems 100% - if it does not then we should dilate it more. I only went as far as was safe today but we could do it more if not 100% better.  You also have a hiatal hernia - stomach partially moves into chest. This makes reflux worse and is part of your problem.  Stay on the Prilosec(omeprazole) every day - this is a safe and effective medication and continuing to take that will reduce the chances of needing a dilation again.  I appreciate the opportunity to care for you. [name], MD, [facility]  Hiatal hernia, including GERD diet handout given to patient. Dilation diet handout given to patient. Stay on Prilosec to reduce the need for further dilation of GERD related stricture. If not 100% better after dilation, consider repeat dilation.  YOU HAD AN ENDOSCOPIC PROCEDURE TODAY AT [facility]:   Refer to the procedure report that was given to you for any specific questions about what was found during the examination.  If the procedure report does not answer your questions, please call your gastroenterologist to clarify.  If you requested that your care partner not be given the details of your procedure findings, then the procedure report has been included in a sealed envelope for you to review at your convenience later.  YOU SHOULD EXPECT: Some feelings of bloating in the abdomen. Passage of more gas than usual.  Walking can help get rid of the air that was put into your GI tract during the procedure and reduce the bloating. If you had a lower endoscopy (such as a colonoscopy or flexible sigmoidoscopy) you may notice spotting of blood in your stool or on the toilet paper. If you underwent a bowel prep for your procedure, you may not have a normal bowel  movement for a few days.  Please Note:  You might notice some irritation and congestion in your nose or some drainage.  This is from the oxygen used during your procedure.  There is no need for concern and it should clear up in a day or so.  SYMPTOMS TO REPORT IMMEDIATELY:   Following upper endoscopy (EGD)  Vomiting of blood or coffee ground material  New chest pain or pain under the shoulder blades  Painful or persistently difficult swallowing  New shortness of breath  Fever of 100F or higher  Black, tarry-looking stools  For urgent or emergent issues, a gastroenterologist can be reached at any hour by calling [phone].   DIET:  We do recommend a small meal at first, but then you may proceed to your regular diet.  Drink plenty of fluids but you should avoid alcoholic beverages for 24 hours.  ACTIVITY:  You should plan to take it easy for the rest of today and you should NOT DRIVE or use heavy machinery until tomorrow (because of the sedation medicines used during the test).    FOLLOW UP: Our staff will call the number listed on your records 48-72 hours following your procedure to check on you and address any questions or concerns that you may have regarding the information given to you following your procedure. If we do not reach you, we will leave a message.  We will attempt to reach you two times.  During this call, we will ask if you have developed any symptoms of COVID 19. If you develop any symptoms (ie: fever, flu-like symptoms, shortness of breath, cough etc.) before then, please call [phone].  If you test positive for Covid 19 in the 2 weeks post procedure, please call and report this information to [place].    If any biopsies were taken you will be contacted by phone or by letter within the next 1-3 weeks.  Please call us at [phone] if you have not heard about the biopsies in 3 weeks.    SIGNATURES/CONFIDENTIALITY: You and/or your care partner have signed  paperwork which will be entered into your electronic medical record.  These signatures attest to the fact that that the information above on your After Visit Summary has been reviewed and is understood.  Full responsibility of the confidentiality of this discharge information lies with you and/or your care-partner.

## 2018-10-30 NOTE — Op Note (Signed)
[facility] Patient Name: [name] Procedure Date: 10/30/2018 2:02 PM MRN: [id number] Endoscopist: Iva Booparl E Gessner , MD Age: [age] Referring MD:  Date of Birth: 1956/01/12 Gender: Male Account #: [id number] Procedure:                Upper GI endoscopy Indications:              Dysphagia Medicines:                Propofol per Anesthesia, Monitored Anesthesia Care Procedure:                Pre-Anesthesia Assessment:                           - Prior to the procedure, a History and Physical                            was performed, and patient medications and                            allergies were reviewed. The patient's tolerance of                            previous anesthesia was also reviewed. The risks                            and benefits of the procedure and the sedation                            options and risks were discussed with the patient.                            All questions were answered, and informed consent                            was obtained. Prior Anticoagulants: The patient has                            taken no previous anticoagulant or antiplatelet                            agents. ASA Grade Assessment: II - A patient with                            mild systemic disease. After reviewing the risks                            and benefits, the patient was deemed in                            satisfactory condition to undergo the procedure.                           After obtaining informed consent, the endoscope was  passed under direct vision. Throughout the                            procedure, the patient's blood pressure, pulse, and                            oxygen saturations were monitored continuously. The                            Endoscope was introduced through the mouth, and                            advanced to the second part of duodenum. The upper                            GI endoscopy was  accomplished without difficulty.                            The patient tolerated the procedure well. Scope In: Scope Out: Findings:                 One benign-appearing, intrinsic moderate stenosis                            was found 35 cm from the incisors. This stenosis                            measured 1.4 cm (inner diameter) x less than one cm                            (in length). The stenosis was traversed. A TTS                            dilator was passed through the scope. Dilation with                            a 16-17-18 mm balloon dilator was performed to 17                            mm. The dilation site was examined and showed                            moderate mucosal disruption and moderate                            improvement in luminal narrowing. Estimated blood                            loss was minimal.                           A 4 cm hiatal hernia was present.  The exam was otherwise without abnormality.                           The cardia and gastric fundus were otherwise normal                            on retroflexion. Complications:            No immediate complications. Estimated Blood Loss:     Estimated blood loss was minimal. Impression:               - Benign-appearing esophageal stenosis. Dilated.                           - 4 cm hiatal hernia. 35-39 cm                           - The examination was otherwise normal.                           - No specimens collected. Recommendation:           - Patient has a contact number available for                            emergencies. The signs and symptoms of potential                            delayed complications were discussed with the                            patient. Return to normal activities tomorrow.                            Written discharge instructions were provided to the                            patient.                           - Clear liquids x 1  hour then soft foods rest of                            day. Start prior diet tomorrow.                           - Continue present medications.                           - GERD DIET                           Stay on PPI CHRONICALLY TO REDUCE NEED FOR FURTHER                            DIATION OF GERD-RELATED STRICTURE  IF NOT 100% BETTER AFTER DILATION TO 17 MM WOULD                            CONSIDER REPEAT DILATION [name], MD 10/30/2018 2:39:05 PM This report has been signed electronically.

## 2018-10-30 NOTE — Telephone Encounter (Signed)
Pt responded "no" to all screening questions °

## 2018-10-30 NOTE — Progress Notes (Signed)
Called to room to assist during endoscopic procedure.  Patient ID and intended procedure confirmed with present staff. Received instructions for my participation in the procedure from the performing physician.  

## 2018-10-30 NOTE — Progress Notes (Signed)
A and O x3. Report to RN. Tolerated MAC anesthesia well.Teeth unchanged after procedure.

## 2018-10-30 NOTE — Progress Notes (Signed)
Pt's states no medical or surgical changes since previsit or office visit. 

## 2018-11-03 ENCOUNTER — Telehealth: Payer: Self-pay

## 2018-11-03 NOTE — Telephone Encounter (Signed)
Attempted to reach pt. With follow-up call following endoscopic procedure 10/30/2018.  LM on pt. Voice mail.  Will try to reach pt. Again later today.

## 2018-11-03 NOTE — Telephone Encounter (Signed)
Left message on 2nd follow up call. 

## 2018-11-09 ENCOUNTER — Other Ambulatory Visit: Payer: Self-pay

## 2018-11-09 LAB — COLOGUARD: Cologuard: NEGATIVE

## 2019-03-16 ENCOUNTER — Other Ambulatory Visit: Payer: Self-pay | Admitting: Neurology

## 2019-04-19 NOTE — Progress Notes (Signed)
Virtual Visit via Video Note The purpose of this virtual visit is to provide medical care while limiting exposure to the novel coronavirus.    Consent was obtained for video visit:  Yes.   Answered questions that patient had about telehealth interaction:  Yes.   I discussed the limitations, risks, security and privacy concerns of performing an evaluation and management service by telemedicine. I also discussed with the patient that there may be a patient responsible charge related to this service. The patient expressed understanding and agreed to proceed.  Pt location: Home Physician Location: office Name of referring provider:  Madelin Headings, MD I connected with Sydnee Cabal Pistilli at patients initiation/request on 04/21/2019 at 10:50 AM EST by video enabled telemedicine application and verified that I am speaking with the correct person using two identifiers. Pt MRN:  [id number] Pt DOB:  Jul 10, 1955 Video Participants:  Quitman Livings   History of Present Illness:  [name] is a 64 year old left-handed man who follows up for migraine.  UPDATE: Intensity:  Moderate Duration:  1 hour most Frequency:  7 days a month (3 to 4 during week prior to next injection). Frequency of abortive medication:2 to 3 days Current NSAIDS:none Current analgesics:none Current triptans:Sumatriptan 100mg  (has not needed to take it last month) Current ergotamine:none Current anti-emetic:none Current muscle relaxants:none Current anti-anxiolytic:none Current sleep aide:none Current Antihypertensive medications:none Current Antidepressant medications:none Current Anticonvulsant medications:none Current anti-CGRP:Emgality every 30 days Current Vitamins/Herbal/Supplements:Resveratrol, Iaso Tea, Chaga, Gano, Omega 3, B6, B12, Turmeric Current Antihistamines/Decongestants:Zyrtec Other therapy:none  Diet:  Avoids sugars/low carbs.  Hydrates. Depression/anxiety: No  Other pain: No  HISTORY: Onset:  64 years old Location:Left greater than right sided Quality:Pressure, throbbing Initial intensity:2-10/10 (usually 6-7/10) Aura:no Prodrome:yawning Associated symptoms: Nausea, dizziness, phonophobia, sometimes sees spots, photophobia. No unilateral numbness or weakness. Initial Duration:2 days (however this current headache has been going on for almost a week) Initial Frequency:8-10 days per month Triggers:  Smell of burning rope Relieving factors:  None Activity:Needs to lay down 4-5 days per month  MRI of the brain with and without contrast was performed on 02/04/14, which showed non-specific white matter signal changes but nothing acute.  Past NSAIDS:Ibuprofen, naproxen Past analgesics:Tylenol, Fioricet, Excedrin Migraine Past abortive triptans:None Past abortive ergotamine:None Past muscle relaxants:None Past anti-emetic:None Past antihypertensive medications:atenolol Past antidepressant medications:Amitriptyline 100 mg (discontinued because it made him "incapacitated ") Past anticonvulsant medications:Topiramate 100 mg at bedtime (stopped due to concern of potential side effects) Past anti-CGRP:Aimovig (switched to [facility] because he had difficulty getting it at his pharmacy). Past vitamins/Herbal/Supplements:None Past antihistamines/decongestants:Benadryl Other past therapies:None  Past Medical History: Past Medical History:  Diagnosis Date  . Allergic rhinitis   . Asthma   . Chicken pox    as a child  . Migraines   . Umbilical hernia     Medications: Outpatient Encounter Medications as of 04/21/2019  Medication Sig  . albuterol (PROVENTIL HFA;VENTOLIN HFA) 108 (90 Base) MCG/ACT inhaler Inhale 2 puffs into the lungs every 6 (six) hours as needed for wheezing or shortness of breath.  . Albuterol Sulfate (PROAIR RESPICLICK) 108 (90 Base) MCG/ACT AEPB Inhale 2 puffs into the lungs every 6 (six) hours  as needed.  06/19/2019 aspirin-acetaminophen-caffeine (EXCEDRIN MIGRAINE) 250-250-65 MG per tablet Take 2 tablets by mouth daily as needed for headache.  . budesonide-formoterol (SYMBICORT) 160-4.5 MCG/ACT inhaler Inhale 2 puffs into the lungs 2 (two) times daily.  . cetirizine (ZYRTEC) 10 MG tablet Take 10 mg by mouth daily.  . Cholecalciferol (VITAMIN D3)  1000 UNITS CAPS Take 1 capsule by mouth daily.  Marland Kitchen EMGALITY 120 MG/ML SOAJ INJECT 240 MG SUBCUTANEOUSLY FOR LOADING DOSE THE FIRST MONTH THEN 120MG  EVERY MONTH AFTER  . Flaxseed MISC by Does not apply route. Whole ground  . Galcanezumab-gnlm (EMGALITY) 120 MG/ML SOSY Inject 120 mg into the skin every 30 (thirty) days.  . montelukast (SINGULAIR) 10 MG tablet Take 1 tablet (10 mg total) by mouth at bedtime. For allergy/ asthma control  . Omega-3 Fatty Acids (OMEGA 3 PO) Take 1 capsule by mouth 2 (two) times daily.  omeprazole (PRILOSEC) 20 MG capsule Take 1 capsule (20 mg total) by mouth daily. Or as directed  . OVER THE COUNTER MEDICATION chaga 100 % pure  . OVER THE COUNTER MEDICATION NRG:  B6-B12 ONCE DAILY  . OVER THE COUNTER MEDICATION [place]  . OVER THE COUNTER MEDICATION GANO 100% PURE EXTRACT  . Probiotic Product (PROBIOTIC PO) Take 1 capsule by mouth daily. Green Vibrance  . triamcinolone (NASACORT ALLERGY 24HR) 55 MCG/ACT AERO nasal inhaler Place 2 sprays into the nose daily.  . TURMERIC PO Take 1 tablet by mouth 3 (three) times daily.  . WHEY PROTEIN PO Take 60 g by mouth.   Facility-Administered Encounter Medications as of 04/21/2019  Medication  . 0.9 %  sodium chloride infusion    Allergies: No Known Allergies  Family History: Family History  Problem Relation Age of Onset  . Diabetes Mellitus II Mother   . Thyroid disease Other   . Hypertension Other   . Thyroid disease Other   . Colon cancer Neg Hx     Social History: Social History   Socioeconomic History  . Marital status: Single    Spouse name: Not on file  .  Number of children: Not on file  . Years of education: Not on file  . Highest education level: Not on file  Occupational History    Employer: [employer]  Tobacco Use  . Smoking status: Never Smoker  . Smokeless tobacco: Never Used  Substance and Sexual Activity  . Alcohol use: No    Alcohol/week: 0.0 standard drinks  . Drug use: No  . Sexual activity: Not on file  Other Topics Concern  . 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 Difficulty of Paying Living Expenses: Not on file  Food Insecurity:   . Worried About 06/19/2019 in the Last Year: Not on file  . Ran Out of Food in the Last Year: Not on file  Transportation Needs:   . Lack of Transportation (Medical): Not on file  . Lack of Transportation (Non-Medical): Not on file  Physical Activity:   . Days of Exercise per Week: Not on file  . Minutes of Exercise per Session: Not on file  Stress:   . Feeling of Stress : Not on file  Social Connections:   . Frequency of Communication with Friends and Family: Not on file  . Frequency of Social Gatherings with Friends and Family: Not on file  . Attends Religious Services: Not on file  . Active Member of Clubs or Organizations: Not on file  . Attends  Club or Organization Meetings: Not on file  . Marital Status: Not on file  Intimate Partner Violence:   . Fear of Current or Ex-Partner: Not on file  . Emotionally Abused: Not on file  . Physically Abused: Not on file  . Sexually Abused: Not on file    Observations/Objective:   Height 5\' 2"  (1.575 m), weight 143 lb (64.9 kg). No acute distress.  Alert and oriented.  Speech fluent and not dysarthric.  Language intact.  Eyes orthophoric on primary gaze.  Face symmetric.  Assessment and Plan:    Migraine without aura, without status migrainosus, not intractable.  1.  For preventative management, Emgality.  As they congregate the last week prior to injection, recommended taking the shot every 28 days. 2.  For abortive therapy, sumatriptan 100mg  3.  Limit use of pain relievers to no more than 2 days out of week to prevent risk of rebound or medication-overuse headache. 4.  Keep headache diary 5.  Exercise, hydration, caffeine cessation, sleep hygiene, monitor for and avoid triggers 6.  Follow up 6 months.   Follow Up Instructions:    -I discussed the assessment and treatment plan with the patient. The patient was provided an opportunity to ask questions and all were answered. The patient agreed with the plan and demonstrated an understanding of the instructions.   The patient was advised to call back or seek an in-person evaluation if the symptoms worsen or if the condition fails to improve as anticipated.   Dudley Major, DO

## 2019-04-20 ENCOUNTER — Encounter: Payer: Self-pay | Admitting: Neurology

## 2019-04-21 ENCOUNTER — Other Ambulatory Visit: Payer: Self-pay

## 2019-04-21 ENCOUNTER — Telehealth (INDEPENDENT_AMBULATORY_CARE_PROVIDER_SITE_OTHER): Payer: BC Managed Care – PPO | Admitting: Neurology

## 2019-04-21 ENCOUNTER — Encounter: Payer: Self-pay | Admitting: Neurology

## 2019-04-21 VITALS — Ht 62.0 in | Wt 143.0 lb

## 2019-04-21 DIAGNOSIS — G43009 Migraine without aura, not intractable, without status migrainosus: Secondary | ICD-10-CM

## 2019-06-16 ENCOUNTER — Encounter: Payer: Self-pay | Admitting: *Deleted

## 2019-06-16 NOTE — Progress Notes (Signed)
[facility] Current Key: [id number] help? Call us at [phone] Outcome Additional Information Required Your PA has been resolved, no additional PA is required. For further inquiries please contact the number on the back of the member prescription card. (Message [phone])

## 2019-06-17 ENCOUNTER — Telehealth: Payer: Self-pay | Admitting: Neurology

## 2019-06-17 NOTE — Telephone Encounter (Signed)
Patient called and said his Emgality needs prior authorization. He said he picked up 1 container yesterday but could not pick up the second because it needs a PA.  [facility] on [address]   Authorization # to call: [id number]

## 2019-06-17 NOTE — Telephone Encounter (Signed)
LVM for patient. Per [name] is a covered drug under Patient's plan unable to start a PA.  Also tried calling number given by pt to speak to the patient insurance to see what could be done. Wrong number.

## 2019-09-04 ENCOUNTER — Other Ambulatory Visit: Payer: Self-pay | Admitting: Neurology

## 2019-09-06 ENCOUNTER — Encounter: Payer: Self-pay | Admitting: Neurology

## 2019-09-06 NOTE — Progress Notes (Addendum)
LIDO MASKE Key: [id number] - PA Case ID: [id number] Need help? Call us at [phone] Outcome Deniedon June 21 Your PA request has been denied. Additional information will be provided in the denial communication. (Message 1140) Your PA request has been denied. Additional information will be provided in the denial communication. (Message 1140) Drug Emgality 120MG /ML auto-injectors (migraine) Form [employer] Electronic PA Form [phone] NCPDP)  Approved for the 1 injection but not for the 2 doses.  Awaiting response from Dr. ([phone] of what to do. Put paperwork in his box.

## 2019-10-20 NOTE — Progress Notes (Signed)
NEUROLOGY FOLLOW UP OFFICE NOTE  [name] [id number]  HISTORY OF PRESENT ILLNESS: [name] is a 64 year old left-handed man who follows up for migraine.  UPDATE: He had trouble getting the [facility] in June.  They had the loading dose prescription on file but not the maintenance dose.  His dose was delayed by two weeks.  The delay triggered increased headaches but has slowed down over the past couple of weeks. Intensity: Moderate Duration: 1 hour Frequency:15 days in past 30 days. Frequency of abortive medication:2 to 3 days Current NSAIDS:none Current analgesics:none Current triptans:Sumatriptan 100mg  (has not needed to take it last month) Current ergotamine:none Current anti-emetic:none Current muscle relaxants:none Current anti-anxiolytic:none Current sleep aide:none Current Antihypertensive medications:none Current Antidepressant medications:none Current Anticonvulsant medications:none Current anti-CGRP:Emgality every 30 days Current Vitamins/Herbal/Supplements:Resveratrol, Iaso Tea, Chaga, Gano, Omega 3, B6, B12, Turmeric Current Antihistamines/Decongestants:Zyrtec Other therapy:none  Diet: Avoids sugars/low carbs. Hydrates. Depression/anxiety: No Other pain: No  HISTORY: Onset: 64 years old Location:Left greater than right sided Quality:Pressure, throbbing Initial intensity:2-10/10 (usually 6-7/10) Aura:burning smell Prodrome:yawning Associated symptoms:Nausea, dizziness, phonophobia, sometimes sees spots, photophobia. No unilateral numbness or weakness. Initial Duration:2 days (however this current headache has been going on for almost a week) Initial Frequency:8-10 days per month Triggers: Smell of burning rope Relieving factors: None Activity:Needs to lay down 4-5 days per month  MRI of the brain with and without contrast was performed on 02/04/14, which showed non-specific white  matter signal changes but nothing acute.  Past NSAIDS:Ibuprofen, naproxen Past analgesics:Tylenol, Fioricet, Excedrin Migraine Past abortive triptans:None Past abortive ergotamine:None Past muscle relaxants:None Past anti-emetic:None Past antihypertensive medications:atenolol Past antidepressant medications:Amitriptyline 100 mg (discontinued because it made him "incapacitated ") Past anticonvulsant medications:Topiramate 100 mg at bedtime (stopped due to concern of potential side effects) Past anti-CGRP:Aimovig (switched to [facility] because he had difficulty getting it at his pharmacy). Past vitamins/Herbal/Supplements:None Past antihistamines/decongestants:Benadryl Other past therapies:None  PAST MEDICAL HISTORY: Past Medical History:  Diagnosis Date   Allergic rhinitis    Asthma    Chicken pox    as a child   Migraines    Umbilical hernia     MEDICATIONS: Current Outpatient Medications on File Prior to Visit  Medication Sig Dispense Refill   albuterol (PROVENTIL HFA;VENTOLIN HFA) 108 (90 Base) MCG/ACT inhaler Inhale 2 puffs into the lungs every 6 (six) hours as needed for wheezing or shortness of breath. 1 Inhaler 1   Albuterol Sulfate (PROAIR RESPICLICK) 108 (90 Base) MCG/ACT AEPB Inhale 2 puffs into the lungs every 6 (six) hours as needed. 1 each 0   aspirin-acetaminophen-caffeine (EXCEDRIN MIGRAINE) 250-250-65 MG per tablet Take 2 tablets by mouth daily as needed for headache.     budesonide-formoterol (SYMBICORT) 160-4.5 MCG/ACT inhaler Inhale 2 puffs into the lungs 2 (two) times daily. 3 Inhaler 3   cetirizine (ZYRTEC) 10 MG tablet Take 10 mg by mouth daily.     Cholecalciferol (VITAMIN D3) 1000 UNITS CAPS Take 1 capsule by mouth daily.     EMGALITY 120 MG/ML SOAJ INJECT 240 MG INTO THE SKIN FOR THE FIRST MONTH, THEN 120 MG MONTHLY THEREAFTER 2 mL 1   Flaxseed MISC by Does not apply route. Whole ground     Galcanezumab-gnlm (EMGALITY) 120 MG/ML  SOSY Inject 120 mg into the skin every 30 (thirty) days. 1 Syringe 11   montelukast (SINGULAIR) 10 MG tablet Take 1 tablet (10 mg total) by mouth at bedtime. For allergy/ asthma control 30 tablet 6   Omega-3 Fatty Acids (OMEGA 3 PO) Take  1 capsule by mouth 2 (two) times daily.     omeprazole (PRILOSEC) 20 MG capsule Take 1 capsule (20 mg total) by mouth daily. Or as directed 30 capsule 5   OVER THE COUNTER MEDICATION chaga 100 % pure     OVER THE COUNTER MEDICATION NRG:  B6-B12 ONCE DAILY     OVER THE COUNTER MEDICATION [place]     OVER THE COUNTER MEDICATION GANO 100% PURE EXTRACT     Probiotic Product (PROBIOTIC PO) Take 1 capsule by mouth daily. Green Vibrance     triamcinolone (NASACORT ALLERGY 24HR) 55 MCG/ACT AERO nasal inhaler Place 2 sprays into the nose daily.     TURMERIC PO Take 1 tablet by mouth 3 (three) times daily.     WHEY PROTEIN PO Take 60 g by mouth.     Current Facility-Administered Medications on File Prior to Visit  Medication Dose Route Frequency Provider Last Rate Last Admin   0.9 %  sodium chloride infusion  500 mL Intravenous Once Iva Boop, MD        ALLERGIES: No Known Allergies  FAMILY HISTORY: Family History  Problem Relation Age of Onset   Diabetes Mellitus II Mother    Thyroid disease Other    Hypertension Other    Thyroid disease Other    Colon cancer Neg Hx    SOCIAL HISTORY: Social History   Socioeconomic History   Marital status: Single    Spouse name: Not on file   Number of children: Not on file   Years of education: Not on file   Highest education level: Not on file  Occupational History    Employer: [employer]  Tobacco Use   Smoking status: Never Smoker   Smokeless tobacco: Never Used  Vaping Use   Vaping Use: Never used  Substance and Sexual Activity   Alcohol use: No    Alcohol/week: 0.0 standard drinks   Drug use: No   Sexual activity: Not on file  Other Topics Concern   Not  on file  Social History Narrative   HH  Of 2   House guest  College     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    No etoh and ocass caffine    Left handed   One story home      Social Determinants of Health   Financial Resource Strain:    Difficulty of Paying Living Expenses:   Food Insecurity:    Worried About Programme researcher, broadcasting/film/video in the Last Year:    Barista in the Last Year:   Transportation Needs:    Freight forwarder (Medical):    Lack of Transportation (Non-Medical):   Physical Activity:    Days of Exercise per Week:    Minutes of Exercise per Session:   Stress:    Feeling of Stress :   Social Connections:    Frequency of Communication with Friends and Family:    Frequency of Social Gatherings with Friends and Family:    Attends Religious Services:    Active Member of Clubs or Organizations:    Attends Engineer, structural:    Marital Status:   Intimate Partner Violence:    Fear of Current or Ex-Partner:    Emotionally Abused:    Physically Abused:    Sexually Abused:     PHYSICAL EXAM: Blood pressure 119/70, pulse 91, height 5\' 2"  (1.575 m), weight  145 lb 9.6 oz (66 kg), SpO2 98 %. General: No acute distress.  Patient appears well-groomed.   Head:  Normocephalic/atraumatic Eyes:  Fundi examined but not visualized Neck: supple, no paraspinal tenderness, full range of motion Heart:  Regular rate and rhythm Lungs:  Clear to auscultation bilaterally Back: No paraspinal tenderness Neurological Exam: alert and oriented to person, place, and time. Attention span and concentration intact, recent and remote memory intact, fund of knowledge intact.  Speech fluent and not dysarthric, language intact.  CN II-XII intact. Bulk and tone normal, muscle strength 5/5 throughout.  Sensation to light touch, temperature and vibration intact.  Deep tendon reflexes 2+ throughout, toes downgoing.  Finger to  nose and heel to shin testing intact.  Gait normal, Romberg negative.  IMPRESSION: Migraine without aura, without status migrainosus, not intractable  PLAN: 1.  For preventative management, Emgality every 28 days 2.  For abortive therapy, sumatriptan 100mg  3.  Limit use of pain relievers to no more than 2 days out of week to prevent risk of rebound or medication-overuse headache. 4.  Keep headache diary 5.  Exercise, hydration, caffeine cessation, sleep hygiene, monitor for and avoid triggers 6. Follow up 6    , DO  CC:  Shon Millet, MD

## 2019-10-21 ENCOUNTER — Ambulatory Visit: Payer: BC Managed Care – PPO | Admitting: Neurology

## 2019-10-28 ENCOUNTER — Other Ambulatory Visit: Payer: Self-pay

## 2019-10-28 ENCOUNTER — Ambulatory Visit: Payer: BC Managed Care – PPO | Admitting: Neurology

## 2019-10-28 ENCOUNTER — Encounter: Payer: Self-pay | Admitting: Neurology

## 2019-10-28 VITALS — BP 119/70 | HR 91 | Ht 62.0 in | Wt 145.6 lb

## 2019-10-28 DIAGNOSIS — G43109 Migraine with aura, not intractable, without status migrainosus: Secondary | ICD-10-CM

## 2019-10-28 DIAGNOSIS — G43009 Migraine without aura, not intractable, without status migrainosus: Secondary | ICD-10-CM | POA: Diagnosis not present

## 2019-10-28 MED ORDER — EMGALITY 120 MG/ML ~~LOC~~ SOSY: 120.0000 mg | PREFILLED_SYRINGE | SUBCUTANEOUS | 11 refills | Status: AC

## 2019-10-28 NOTE — Patient Instructions (Signed)
  1. Continue Emgality every 28 days 2. Take sumatriptan 100mg  at earliest onset of headache.  May repeat dose once in 2 hours if needed.  Maximum 2 tablets in 24 hours. 3. Limit use of pain relievers to no more than 2 days out of the week.  These medications include acetaminophen, NSAIDs (ibuprofen/Advil/Motrin, naproxen/Aleve, triptans (Imitrex/sumatriptan), Excedrin, and narcotics.  This will help reduce risk of rebound headaches. 4. Be aware of common food triggers:  - Caffeine:  coffee, black tea, cola, Mt. Dew  - Chocolate  - Dairy:  aged cheeses (brie, blue, cheddar, gouda, Dover, provolone, Brooks, Swiss, etc), chocolate milk, buttermilk, sour cream, limit eggs and yogurt  - Nuts, peanut butter  - Alcohol  - Cereals/grains:  FRESH breads (fresh bagels, sourdough, doughnuts), yeast productions  - Processed/canned/aged/cured meats (pre-packaged deli meats, hotdogs)  - MSG/glutamate:  soy sauce, flavor enhancer, pickled/preserved/marinated foods  - Sweeteners:  aspartame (Equal, Nutrasweet).  Sugar and Splenda are okay  - Vegetables:  legumes (lima beans, lentils, snow peas, fava beans, pinto peans, peas, garbanzo beans), sauerkraut, onions, olives, pickles  - Fruit:  avocados, bananas, citrus fruit (orange, lemon, grapefruit), mango  - Other:  Frozen meals, macaroni and cheese 5. Routine exercise 6. Stay adequately hydrated (aim for 64 oz water daily) 7. Keep headache diary 8. Maintain proper stress management 9. Maintain proper sleep hygiene 10. Do not skip meals 11. Consider supplements:  magnesium citrate 400mg  daily, riboflavin 400mg  daily, coenzyme Q10 100mg  three times daily.

## 2019-11-15 ENCOUNTER — Encounter: Payer: Self-pay | Admitting: Neurology

## 2019-11-15 NOTE — Progress Notes (Addendum)
[name] [name] - PA Case ID: [id number] - Rx #: [id number] Need help? Call us at [phone] Outcome Deniedtoday Your PA request has been denied. Additional information will be provided in the denial communication. (Message 1140) Your PA request has been denied. Additional information will be provided in the denial communication. (Message 1140) Drug Emgality 120MG /ML auto-injectors (migraine) Form [employer] Electronic PA Form (2017 NCPDP) Original Claim Info 13  Called to talk with [facility] [employer] Rep and she said that everything was fine on account for PA. Next refill is not until 12/01/19. She ran authorization for that day and it was approved. Disregard denial.

## 2020-01-22 ENCOUNTER — Other Ambulatory Visit: Payer: Self-pay | Admitting: Neurology

## 2020-02-14 ENCOUNTER — Other Ambulatory Visit: Payer: Self-pay | Admitting: Neurology

## 2020-02-14 NOTE — Telephone Encounter (Signed)
Script sent 11/06/19 with 11 refills.

## 2020-05-01 ENCOUNTER — Ambulatory Visit: Payer: BC Managed Care – PPO | Admitting: Neurology

## 2022-01-10 ENCOUNTER — Telehealth: Payer: Self-pay

## 2022-01-10 NOTE — Telephone Encounter (Signed)
Cologard recall letter mailed to patient.
